# Patient Record
Sex: Female | Born: 2000 | Race: Black or African American | Hispanic: No | Marital: Single | Smoking: Never smoker
Health system: Southern US, Community
[De-identification: ages and names within clinical notes are randomized; demographics above are authoritative.]

## PROBLEM LIST (undated history)

## (undated) DIAGNOSIS — E282 Polycystic ovarian syndrome: Secondary | ICD-10-CM

## (undated) DIAGNOSIS — M549 Dorsalgia, unspecified: Secondary | ICD-10-CM

## (undated) DIAGNOSIS — J45909 Unspecified asthma, uncomplicated: Secondary | ICD-10-CM

## (undated) DIAGNOSIS — M2550 Pain in unspecified joint: Secondary | ICD-10-CM

## (undated) DIAGNOSIS — F909 Attention-deficit hyperactivity disorder, unspecified type: Secondary | ICD-10-CM

## (undated) DIAGNOSIS — G932 Benign intracranial hypertension: Secondary | ICD-10-CM

## (undated) HISTORY — DX: Pain in unspecified joint: M25.50

## (undated) HISTORY — DX: Attention-deficit hyperactivity disorder, unspecified type: F90.9

## (undated) HISTORY — DX: Benign intracranial hypertension: G93.2

## (undated) HISTORY — DX: Dorsalgia, unspecified: M54.9

## (undated) HISTORY — PX: NO PAST SURGERIES: SHX2092

---
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
## 2018-01-23 DIAGNOSIS — Z23 Encounter for immunization: Secondary | ICD-10-CM | POA: Diagnosis not present

## 2018-01-23 DIAGNOSIS — Z6841 Body Mass Index (BMI) 40.0 and over, adult: Secondary | ICD-10-CM | POA: Diagnosis not present

## 2018-01-23 DIAGNOSIS — Z131 Encounter for screening for diabetes mellitus: Secondary | ICD-10-CM | POA: Diagnosis not present

## 2018-01-23 DIAGNOSIS — Z7189 Other specified counseling: Secondary | ICD-10-CM | POA: Diagnosis not present

## 2018-03-26 DIAGNOSIS — N911 Secondary amenorrhea: Secondary | ICD-10-CM | POA: Diagnosis not present

## 2018-03-26 DIAGNOSIS — E782 Mixed hyperlipidemia: Secondary | ICD-10-CM | POA: Diagnosis not present

## 2018-03-26 DIAGNOSIS — E559 Vitamin D deficiency, unspecified: Secondary | ICD-10-CM | POA: Diagnosis not present

## 2018-03-26 DIAGNOSIS — D508 Other iron deficiency anemias: Secondary | ICD-10-CM | POA: Diagnosis not present

## 2018-04-03 DIAGNOSIS — N911 Secondary amenorrhea: Secondary | ICD-10-CM | POA: Diagnosis not present

## 2018-04-03 DIAGNOSIS — Z309 Encounter for contraceptive management, unspecified: Secondary | ICD-10-CM | POA: Diagnosis not present

## 2018-04-03 DIAGNOSIS — E6609 Other obesity due to excess calories: Secondary | ICD-10-CM | POA: Diagnosis not present

## 2018-04-03 DIAGNOSIS — Z6841 Body Mass Index (BMI) 40.0 and over, adult: Secondary | ICD-10-CM | POA: Diagnosis not present

## 2018-08-24 DIAGNOSIS — Z20828 Contact with and (suspected) exposure to other viral communicable diseases: Secondary | ICD-10-CM | POA: Diagnosis not present

## 2018-09-09 DIAGNOSIS — Z20828 Contact with and (suspected) exposure to other viral communicable diseases: Secondary | ICD-10-CM | POA: Diagnosis not present

## 2018-11-13 DIAGNOSIS — Z20828 Contact with and (suspected) exposure to other viral communicable diseases: Secondary | ICD-10-CM | POA: Diagnosis not present

## 2019-01-27 DIAGNOSIS — Z7189 Other specified counseling: Secondary | ICD-10-CM | POA: Diagnosis not present

## 2019-01-27 DIAGNOSIS — Z03818 Encounter for observation for suspected exposure to other biological agents ruled out: Secondary | ICD-10-CM | POA: Diagnosis not present

## 2019-06-09 DIAGNOSIS — Z6841 Body Mass Index (BMI) 40.0 and over, adult: Secondary | ICD-10-CM | POA: Diagnosis not present

## 2019-06-09 DIAGNOSIS — L678 Other hair color and hair shaft abnormalities: Secondary | ICD-10-CM | POA: Diagnosis not present

## 2019-06-09 DIAGNOSIS — Z113 Encounter for screening for infections with a predominantly sexual mode of transmission: Secondary | ICD-10-CM | POA: Diagnosis not present

## 2019-06-09 DIAGNOSIS — N920 Excessive and frequent menstruation with regular cycle: Secondary | ICD-10-CM | POA: Diagnosis not present

## 2019-06-09 DIAGNOSIS — N910 Primary amenorrhea: Secondary | ICD-10-CM | POA: Diagnosis not present

## 2019-07-01 ENCOUNTER — Other Ambulatory Visit: Payer: Self-pay | Admitting: Nurse Practitioner

## 2019-07-01 DIAGNOSIS — N920 Excessive and frequent menstruation with regular cycle: Secondary | ICD-10-CM | POA: Diagnosis not present

## 2019-07-01 DIAGNOSIS — E282 Polycystic ovarian syndrome: Secondary | ICD-10-CM | POA: Diagnosis not present

## 2019-07-03 ENCOUNTER — Ambulatory Visit
Admission: RE | Admit: 2019-07-03 | Discharge: 2019-07-03 | Disposition: A | Discharge status: Home / Self Care | Payer: BC Managed Care – PPO | Source: Ambulatory Visit | Attending: Nurse Practitioner | Admitting: Nurse Practitioner

## 2019-07-03 DIAGNOSIS — N920 Excessive and frequent menstruation with regular cycle: Secondary | ICD-10-CM

## 2020-01-06 ENCOUNTER — Other Ambulatory Visit: Payer: Self-pay

## 2020-01-06 ENCOUNTER — Ambulatory Visit
Admission: EM | Admit: 2020-01-06 | Discharge: 2020-01-06 | Disposition: A | Discharge status: Home / Self Care | Payer: BC Managed Care – PPO | Attending: Physician Assistant | Admitting: Physician Assistant

## 2020-01-06 DIAGNOSIS — R519 Headache, unspecified: Secondary | ICD-10-CM

## 2020-01-06 MED ORDER — KETOROLAC TROMETHAMINE 15 MG/ML IJ SOLN
15.0000 mg | Freq: Once | INTRAMUSCULAR | Status: AC
  Administered 2020-01-06: 15 mg via INTRAMUSCULAR

## 2020-01-06 MED ORDER — TIZANIDINE HCL 2 MG PO TABS: 2.0000 mg | ORAL_TABLET | Freq: Three times a day (TID) | ORAL | 0 refills | Status: DC | PRN

## 2020-01-06 MED ORDER — DEXAMETHASONE SODIUM PHOSPHATE 10 MG/ML IJ SOLN
10.0000 mg | Freq: Once | INTRAMUSCULAR | Status: AC
  Administered 2020-01-06: 10 mg via INTRAMUSCULAR

## 2020-01-06 MED ORDER — METOCLOPRAMIDE HCL 5 MG/ML IJ SOLN
5.0000 mg | Freq: Once | INTRAMUSCULAR | Status: AC
  Administered 2020-01-06: 5 mg via INTRAMUSCULAR

## 2020-01-06 MED ORDER — DICLOFENAC SODIUM 1 % EX GEL: 2.0000 g | Freq: Four times a day (QID) | CUTANEOUS | 0 refills | Status: DC

## 2020-01-06 NOTE — Discharge Instructions (Signed)
Toradol, Reglan, Decadron injection in office today.  As discussed, wonders for headaches as possible neck pain (tension headache). Tizanidine as needed, this can make you drowsy, so do not take if you are going to drive, operate heavy machinery, or make important decisions. Follow up with PCP/neurologist if symptoms not improving.  If having vision changes, passing out, confusion, one-sided weakness, confusion was present for further evaluation.

## 2020-01-06 NOTE — ED Triage Notes (Signed)
Pt states she has had a recurring headache for 2 weeks and treats it with ibuprofen and it returns shortly. Pt states she cannot eat or sleep due to the headaches causing nausea. Pt is aox4 and ambulatory.

## 2020-01-06 NOTE — ED Provider Notes (Signed)
[facility]    CSN: [id number] Arrival date & time: 01/06/20  7062      History   Chief Complaint Chief Complaint  Patient presents with  . Headache    x 2 weeks    HPI Stephanie Cohen is a 19 y.o. female.   19 year old female comes in for 2 week history of recurring headaches. States headache is from the neck up to the head, can be pounding in nature when severe. Nausea without vomiting. Photophobic, phonophobic at times. Denies head injury, LOC. Denies URI symptoms, fever. Had episodes of feeling weak.      History reviewed. No pertinent past medical history.  There are no problems to display for this patient.   History reviewed. No pertinent surgical history.  OB History   No obstetric history on file.      Home Medications    Prior to Admission medications   Medication Sig Start Date End Date Taking? Authorizing Provider  diclofenac Sodium (VOLTAREN) 1 % GEL Apply 2 g topically 4 (four) times daily. 01/06/20   Cathie Hoops, [place]ager [place] V, PA-C  tiZANidine (ZANAFLEX) 2 MG tablet Take 1 tablet (2 mg total) by mouth every 8 (eight) hours as needed for muscle spasms. 01/06/20   Belinda Fisher, PA-C    Family History History reviewed. No pertinent family history.  Social History Social History   Tobacco Use  . Smoking status: Never Smoker  . Smokeless tobacco: Never Used  Vaping Use  . Vaping Use: Never used  Substance Use Topics  . Alcohol use: Not Currently  . Drug use: Never     Allergies   Patient has no known allergies.   Review of Systems Review of Systems  Reason unable to perform ROS: See HPI as above.     Physical Exam Triage Vital Signs ED Triage Vitals  Enc Vitals Group     BP 01/06/20 1036 115/76     Pulse Rate 01/06/20 1036 88     Resp 01/06/20 1036 18     Temp 01/06/20 1036 98.4 F (36.9 C)     Temp Source 01/06/20 1036 Oral     SpO2 01/06/20 1036 98 %     Weight --      Height --      Head Circumference --      Peak Flow  --      Pain Score 01/06/20 1038 6     Pain Loc --      Pain Edu? --      Excl. in GC? --    No data found.  Updated Vital Signs BP 115/76 (BP Location: Right Arm)   Pulse 88   Temp 98.4 F (36.9 C) (Oral)   Resp 18   SpO2 98%   Physical Exam Constitutional:      General: She is not in acute distress.    Appearance: Normal appearance. She is well-developed. She is not toxic-appearing or diaphoretic.  HENT:     Head: Normocephalic and atraumatic.  Eyes:     Extraocular Movements: Extraocular movements intact.     Conjunctiva/sclera: Conjunctivae normal.     Pupils: Pupils are equal, round, and reactive to light.  Neck:     Comments: Tenderness to palpation of bilateral neck.  Pulmonary:     Effort: Pulmonary effort is normal. No respiratory distress.  Musculoskeletal:     Cervical back: Normal range of motion and neck supple.     Comments: Full ROM of  BUE. Strength/sensation intact.   Skin:    General: Skin is warm and dry.  Neurological:     Mental Status: She is alert and oriented to person, place, and time.     Comments: Cranial nerves II-XII grossly intact. Strength 5/5 bilaterally for upper and lower extremity. Sensation intact. Normal coordination. Negative pronator drift, romberg. Gait intact. Able to ambulate on own without difficulty.       UC Treatments / Results  Labs (all labs ordered are listed, but only abnormal results are displayed) Labs Reviewed - No data to display  EKG   Radiology No results found.  Procedures Procedures (including critical care time)  Medications Ordered in UC Medications  metoCLOPramide (REGLAN) injection 5 mg (has no administration in time range)  dexamethasone (DECADRON) injection 10 mg (has no administration in time range)  ketorolac (TORADOL) 15 MG/ML injection 15 mg (has no administration in time range)    Initial Impression / Assessment and Plan / UC Course  I have reviewed the triage vital signs and the  nursing notes.  Pertinent labs & imaging results that were available during my care of the patient were reviewed by me and considered in my medical decision making (see chart for details).    Neurology exam grossly intact without focal deficits.  Patient alert and oriented without slurring of words.  Toradol, Reglan, Decadron injection in office today. ?  Tension headache due to neck pain.  Will add on tizanidine for symptomatic management.  Return precautions given.  Final Clinical Impressions(s) / UC Diagnoses   Final diagnoses:  Acute intractable headache, unspecified headache type   ED Prescriptions    Medication Sig Dispense Auth. Provider   tiZANidine (ZANAFLEX) 2 MG tablet Take 1 tablet (2 mg total) by mouth every 8 (eight) hours as needed for muscle spasms. 15 tablet [name] V, PA-C   diclofenac Sodium (VOLTAREN) 1 % GEL Apply 2 g topically 4 (four) times daily. 50 g Belinda Fisher, PA-C     PDMP not reviewed this encounter.   Belinda Fisher, PA-C 01/06/20 1111

## 2020-01-07 DIAGNOSIS — Z131 Encounter for screening for diabetes mellitus: Secondary | ICD-10-CM | POA: Diagnosis not present

## 2020-01-07 DIAGNOSIS — Z23 Encounter for immunization: Secondary | ICD-10-CM | POA: Diagnosis not present

## 2020-01-07 DIAGNOSIS — G43809 Other migraine, not intractable, without status migrainosus: Secondary | ICD-10-CM | POA: Diagnosis not present

## 2020-01-07 DIAGNOSIS — Z1152 Encounter for screening for COVID-19: Secondary | ICD-10-CM | POA: Diagnosis not present

## 2020-01-29 DIAGNOSIS — G43809 Other migraine, not intractable, without status migrainosus: Secondary | ICD-10-CM | POA: Diagnosis not present

## 2020-12-05 ENCOUNTER — Other Ambulatory Visit: Payer: Self-pay

## 2020-12-05 ENCOUNTER — Ambulatory Visit (HOSPITAL_COMMUNITY)
Admission: EM | Admit: 2020-12-05 | Discharge: 2020-12-05 | Disposition: A | Discharge status: Home / Self Care | Payer: No Payment, Other | Attending: Registered Nurse | Admitting: Registered Nurse

## 2020-12-05 ENCOUNTER — Encounter (HOSPITAL_COMMUNITY): Payer: Self-pay | Admitting: Registered Nurse

## 2020-12-05 DIAGNOSIS — F909 Attention-deficit hyperactivity disorder, unspecified type: Secondary | ICD-10-CM | POA: Insufficient documentation

## 2020-12-05 DIAGNOSIS — Z8659 Personal history of other mental and behavioral disorders: Secondary | ICD-10-CM

## 2020-12-05 DIAGNOSIS — Z79899 Other long term (current) drug therapy: Secondary | ICD-10-CM

## 2020-12-05 NOTE — ED Provider Notes (Signed)
Behavioral Health Urgent Care Medical Screening Exam  Patient Name: Stephanie Cohen MRN: [id number] Date of Evaluation: 12/05/20 Chief Complaint:   Diagnosis:  Final diagnoses:  History of ADHD  Referred for medication therapy management    History of Present illness: Stephanie Cohen is a 20 y.o. female patient presented to [facility] as a walk in seeking treatment for ADHD  Stephanie Cohen, 64 y.o., female patient seen face to face by this provider, consulted with Dr. Earlene Plater; and chart reviewed on 12/05/20.  On evaluation Stephanie Cohen reports she has a history of ADHD and has been treated in the past.  States she is back in school and needs to establish outpatient services with provider for the treatment of ADHD medication management.  Patient states she was receiving services at [facility] but wasn't given any official documentation of diagnosis of ADHD, even though she was offered medication wanted to have the official documentation and testing for ADHD before starting any medication management.  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  Patient denies suicidal/self-harm/homicidal ideation, psychosis, and paranoia.    During evaluation Stephanie Cohen is sitting up right in chair in no acute distress.  She is alert/oriented x 4; calm/cooperative; and mood congruent with affect.  She is speaking in a clear tone at moderate volume, and normal pace; with good eye contact.  Her thought process is coherent and relevant; There is no indication that she is currently responding to internal/external stimuli or experiencing delusional thought content; and she has denied suicidal/self-harm/homicidal ideation, psychosis, and paranoia.   Patient has remained calm throughout assessment and has answered questions appropriately.  Unable to get an appointment  for medication management intake at [facility] recommended that she come during walk in hours.  Resources given.    At this time Stephanie Cohen is educated and verbalizes understanding of mental health resources and other crisis services in the community. She is instructed to call 911 and present to the nearest emergency room should she experience any suicidal/homicidal ideation, auditory/visual/hallucinations, or detrimental worsening of her mental health condition.  She was also given a list of resources that offer outpatient psychiatric services and informed that she could come to [facility] walk in hours for medication management intake.    Psychiatric Specialty Exam  Presentation  General Appearance:Appropriate for Environment; Casual  Eye Contact:Good  Speech:Clear and Coherent; Normal Rate  Speech Volume:Normal  Handedness:Right   Mood and Affect  Mood:Euthymic  Affect:Appropriate; Congruent   Thought Process  Thought Processes:Coherent; Goal Directed  Descriptions of Associations:Intact  Orientation:Full (Time, Place and Person)  Thought Content:WDL; Logical    Hallucinations:None  Ideas of Reference:None  Suicidal Thoughts:No  Homicidal Thoughts:No   Sensorium  Memory:Immediate Good; Recent Good; Remote Good  Judgment:Intact  Insight:Present   Executive Functions  Concentration:Good  Attention Span:Good  Recall:Good  Fund of Knowledge:Good  Language:Good   Psychomotor Activity  Psychomotor Activity:Normal   Assets  Assets:Communication Skills; Desire for Improvement; Housing; Resilience; Social Support; Transportation   Sleep  Sleep:Good  Number of hours:  No data recorded  Nutritional Assessment (For OBS and FBC admissions only) Has the patient had a weight loss or gain of 10 pounds or more in the last 3 months?: No Has the patient had a decrease in food intake/or appetite?: No Does the patient have  dental problems?: No Does the  patient have eating habits or behaviors that may be indicators of an eating disorder including binging or inducing vomiting?: No Has the patient recently lost weight without trying?: 0 Has the patient been eating poorly because of a decreased appetite?: 0 Malnutrition Screening Tool Score: 0    Physical Exam: Physical Exam Vitals and nursing note reviewed. Exam conducted with a chaperone present.  Constitutional:      General: She is not in acute distress.    Appearance: Normal appearance. She is not ill-appearing.  Cardiovascular:     Rate and Rhythm: Normal rate.  Pulmonary:     Effort: Pulmonary effort is normal.  Musculoskeletal:        General: Normal range of motion.     Cervical back: Normal range of motion.  Skin:    General: Skin is warm and dry.  Neurological:     Mental Status: She is alert and oriented to person, place, and time.  Psychiatric:        Attention and Perception: Attention and perception normal. She does not perceive auditory or visual hallucinations.        Mood and Affect: Mood and affect normal.        Speech: Speech normal.        Behavior: Behavior normal. Behavior is cooperative.        Thought Content: Thought content normal. Thought content is not paranoid or delusional. Thought content does not include homicidal or suicidal ideation.        Cognition and Memory: Cognition and memory normal.        Judgment: Judgment normal.   Review of Systems  Constitutional: Negative.   HENT: Negative.    Eyes: Negative.   Respiratory: Negative.    Cardiovascular: Negative.   Gastrointestinal: Negative.   Genitourinary: Negative.   Musculoskeletal: Negative.   Skin: Negative.   Neurological: Negative.   Endo/Heme/Allergies: Negative.   Psychiatric/Behavioral:  Negative for depression (Denies), hallucinations (Denies), substance abuse (Denies) and suicidal ideas (Denies). The patient is not nervous/anxious (Denies) and does not have insomnia.         Patient reporting history of ADHD and treated in the past.  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 rate 18, SpO2 97 %. There is no height or weight on file to calculate BMI.  Musculoskeletal: Strength & Muscle Tone: within normal limits Gait & Station: normal Patient leans: N/A   [facility]SE Discharge Disposition for Follow up and Recommendations: Based on my evaluation the patient does not appear to have an emergency medical condition and can be discharged with resources and follow up care in outpatient services for Medication Management Referral to [facility] for medication management    Follow-up Information     [facility].   Specialty: Urgent Care Why: New Therapy walkin:  Monday-Wednesday from 7:30am-12:30pm.  New Medication management walkin Monday-Friday from 7:30 am to 11:00am.  Patient will be taken in the order that they come.  You may not be seen on the same day as walkin. first come first serve Contact information: [address] [place] [address] [phone]                 Assunta Found, NP 12/05/2020, 4:05 PM

## 2020-12-05 NOTE — Progress Notes (Signed)
   12/05/20 1616  [facility] Triage Screening (Walk-ins at [facility] only)  How Did You Hear About [place]? Self  What Is the Reason for Your Visit/Call Today? Patient presents requesting assessment.  She states she has been diagnosed by [facility] with ADHD, however there was no formal diagnosis or documentation.  She is requesting a diagnostic evaluation and medication if recommended.  Patient denies SI, HI and AVH.  She denies SA hx.  Patient is being referred to [facility] for further evaluation.  How Long Has This Been Causing You Problems? > than 6 months  Have You Recently Had Any Thoughts About Hurting Yourself? No  Are You Planning to Commit Suicide/Harm Yourself At This time? No  Have you Recently Had Thoughts About Hurting Someone Karolee Ohs? No  Are You Planning To Harm Someone At This Time? No  Are you currently experiencing any auditory, visual or other hallucinations? No  Have You Used Any Alcohol or Drugs in the Past 24 Hours? No  Do you have any current medical co-morbidities that require immediate attention? No  Clinician description of patient physical appearance/behavior: Patient is calm, cooperative and pleasant.  What Do You Feel Would Help You the Most Today? Medication(s);Treatment for Depression or other mood problem  If access to [facility] was not available, would you have sought care in the Emergency Department? No  Determination of Need Routine (7 days)  Options For Referral Outpatient Therapy;Medication Management

## 2021-08-04 ENCOUNTER — Ambulatory Visit
Admission: EM | Admit: 2021-08-04 | Discharge: 2021-08-04 | Disposition: A | Discharge status: Home / Self Care | Payer: 59

## 2021-08-04 DIAGNOSIS — H53122 Transient visual loss, left eye: Secondary | ICD-10-CM | POA: Diagnosis not present

## 2021-08-04 HISTORY — DX: Polycystic ovarian syndrome: E28.2

## 2021-08-04 HISTORY — DX: Unspecified asthma, uncomplicated: J45.909

## 2021-08-04 NOTE — ED Provider Notes (Signed)
[facility]    CSN: [id number] Arrival date & time: 08/04/21  1928      History   Chief Complaint Chief Complaint  Patient presents with   left eye vision loss    HPI Stephanie Cohen is a 21 y.o. female.   Patient presents due to concerns of eye redness and vision loss of left eye.  Patient reports that she was seen by telehealth and prescribed Polytrim antibiotic eyedrops for suspected bacterial conjunctivitis of both eyes.  She had eye redness, itchiness, discomfort of bilateral eyes with watery drainage over the past few days.  Symptoms have improved with antibiotic drops.  She is concerned today due to an episode that lasted a few seconds of complete vision loss in the left eye.  This has now resolved.  Denies any associated headache, dizziness, nausea, vomiting.  Denies any recent head injuries.  Patient does wear contacts and wears glasses.  Patient states that she has not worn her contacts since symptoms started.    Past Medical History:  Diagnosis Date   Asthma    PCOS (polycystic ovarian syndrome)     Patient Active Problem List   Diagnosis Date Noted   History of ADHD 12/05/2020   Referred for medication therapy management 12/05/2020    History reviewed. No pertinent surgical history.  OB History   No obstetric history on file.      Home Medications    Prior to Admission medications   Medication Sig Start Date End Date Taking? Authorizing Provider  diclofenac Sodium (VOLTAREN) 1 % GEL Apply 2 g topically 4 (four) times daily. 01/06/20   Cathie Hoops, Amy V, PA-C  tiZANidine (ZANAFLEX) 2 MG tablet Take 1 tablet (2 mg total) by mouth every 8 (eight) hours as needed for muscle spasms. 01/06/20   Belinda Fisher, PA-C    Family History History reviewed. No pertinent family history.  Social History Social History   Tobacco Use   Smoking status: Never   Smokeless tobacco: Never  Vaping Use   Vaping Use: Never used  Substance Use Topics   Alcohol  use: Not Currently   Drug use: Never     Allergies   Patient has no known allergies.   Review of Systems Review of Systems Per HPI  Physical Exam Triage Vital Signs ED Triage Vitals [08/04/21 1940]  Enc Vitals Group     BP 105/68     Pulse Rate 90     Resp 18     Temp 98.1 F (36.7 C)     Temp Source Oral     SpO2 96 %     Weight      Height      Head Circumference      Peak Flow      Pain Score 0     Pain Loc      Pain Edu?      Excl. in GC?    No data found.  Updated Vital Signs BP 105/68 (BP Location: Left Arm)   Pulse 90   Temp 98.1 F (36.7 C) (Oral)   Resp 18   SpO2 96%   Visual Acuity Right Eye Distance: 20/20 Left Eye Distance: 20/30 Bilateral Distance: 20/20  Right Eye Near:   Left Eye Near:    Bilateral Near:     Physical Exam Constitutional:      General: She is not in acute distress.    Appearance: Normal appearance. She is not toxic-appearing or diaphoretic.  HENT:     Head: Normocephalic and atraumatic.  Eyes:     General: Lids are normal. Lids are everted, no foreign bodies appreciated. Vision grossly intact. Gaze aligned appropriately.     Extraocular Movements: Extraocular movements intact.     Conjunctiva/sclera: Conjunctivae normal.     Pupils: Pupils are equal, round, and reactive to light.  Cardiovascular:     Rate and Rhythm: Normal rate and regular rhythm.     Pulses: Normal pulses.     Heart sounds: Normal heart sounds.  Pulmonary:     Effort: Pulmonary effort is normal.     Breath sounds: Normal breath sounds.  Neurological:     General: No focal deficit present.     Mental Status: She is alert and oriented to person, place, and time. Mental status is at baseline.     Cranial Nerves: Cranial nerves 2-12 are intact.     Sensory: Sensation is intact.     Motor: Motor function is intact.     Coordination: Coordination is intact.     Gait: Gait is intact.  Psychiatric:        Mood and Affect: Mood normal.         Behavior: Behavior normal.        Thought Content: Thought content normal.        Judgment: Judgment normal.     UC Treatments / Results  Labs (all labs ordered are listed, but only abnormal results are displayed) Labs Reviewed - No data to display  EKG   Radiology No results found.  Procedures Procedures (including critical care time)  Medications Ordered in UC Medications - No data to display  Initial Impression / Assessment and Plan / UC Course  I have reviewed the triage vital signs and the nursing notes.  Pertinent labs & imaging results that were available during my care of the patient were reviewed by me and considered in my medical decision making (see chart for details).     There were no obvious abnormalities noted on eye exam.  Low concern for conjunctivitis on exam.  Dr. Dione Booze with on-call ophthalmology was called due to concern for vision loss.  He did not think that emergent evaluation with ophthalmology was necessary given that vision loss had resolved.  He recommended stroke work-up if necessary.  Therefore, patient was advised to go to the ER for further evaluation and management to rule out neurological complications.  Patient declined going to the hospital.  Risks associated with not going to the hospital were discussed with patient.  Patient voiced understanding.  Patient wished to simply follow-up with ophthalmology in the next few days. Provided patient with contact info.  Patient was also given strict return and ER precautions.  Visual acuity was normal on discharge and exam.  Patient verbalized understanding and was agreeable with plan. Final Clinical Impressions(s) / UC Diagnoses   Final diagnoses:  Episode of visual loss of left eye     Discharge Instructions      Please go to the ER if symptoms of headache, blurred vision, nausea, vomiting, dizziness occur.  Follow-up with provided contact information for ophthalmology if symptoms just simply  persist.    ED Prescriptions   None    PDMP not reviewed this encounter.   Gustavus Bryant, [place] 08/04/21 2003

## 2021-08-04 NOTE — Discharge Instructions (Signed)
Please go to the ER if symptoms of headache, blurred vision, nausea, vomiting, dizziness occur.  Follow-up with provided contact information for ophthalmology if symptoms just simply persist.

## 2021-08-04 NOTE — ED Triage Notes (Signed)
Pt c/o conjunctivitis seen by telehealth and given abx gtts. States today her left eye was more edematous and red. States in waiting area her left eye "suddenly went black" but didn't last for long.   Denies headache, nausea

## 2021-08-08 ENCOUNTER — Encounter (HOSPITAL_COMMUNITY): Payer: Self-pay | Admitting: Internal Medicine

## 2021-08-08 ENCOUNTER — Observation Stay (HOSPITAL_COMMUNITY): Payer: 59

## 2021-08-08 ENCOUNTER — Other Ambulatory Visit: Payer: Self-pay

## 2021-08-08 ENCOUNTER — Emergency Department (HOSPITAL_COMMUNITY): Payer: 59

## 2021-08-08 ENCOUNTER — Inpatient Hospital Stay (HOSPITAL_COMMUNITY)
Admission: EM | Admit: 2021-08-08 | Discharge: 2021-08-10 | DRG: 103 | Disposition: A | Discharge status: Home / Self Care | Payer: 59 | Source: Ambulatory Visit | Attending: Internal Medicine | Admitting: Internal Medicine

## 2021-08-08 DIAGNOSIS — J4520 Mild intermittent asthma, uncomplicated: Secondary | ICD-10-CM | POA: Diagnosis present

## 2021-08-08 DIAGNOSIS — G932 Benign intracranial hypertension: Secondary | ICD-10-CM | POA: Diagnosis not present

## 2021-08-08 DIAGNOSIS — Z6841 Body Mass Index (BMI) 40.0 and over, adult: Secondary | ICD-10-CM

## 2021-08-08 DIAGNOSIS — E662 Morbid (severe) obesity with alveolar hypoventilation: Secondary | ICD-10-CM | POA: Diagnosis not present

## 2021-08-08 DIAGNOSIS — H53123 Transient visual loss, bilateral: Secondary | ICD-10-CM | POA: Diagnosis not present

## 2021-08-08 DIAGNOSIS — H539 Unspecified visual disturbance: Secondary | ICD-10-CM | POA: Diagnosis not present

## 2021-08-08 DIAGNOSIS — D72829 Elevated white blood cell count, unspecified: Secondary | ICD-10-CM | POA: Diagnosis present

## 2021-08-08 DIAGNOSIS — E282 Polycystic ovarian syndrome: Secondary | ICD-10-CM | POA: Diagnosis present

## 2021-08-08 DIAGNOSIS — Z79899 Other long term (current) drug therapy: Secondary | ICD-10-CM

## 2021-08-08 DIAGNOSIS — H509 Unspecified strabismus: Secondary | ICD-10-CM | POA: Diagnosis present

## 2021-08-08 LAB — CBC WITH DIFFERENTIAL/PLATELET
Abs Immature Granulocytes: 0.06 10*3/uL (ref 0.00–0.07)
Basophils Absolute: 0.1 10*3/uL (ref 0.0–0.1)
Basophils Relative: 1 %
Eosinophils Absolute: 0.3 10*3/uL (ref 0.0–0.5)
Eosinophils Relative: 3 %
HCT: 36.8 % (ref 36.0–46.0)
Hemoglobin: 12.1 g/dL (ref 12.0–15.0)
Immature Granulocytes: 1 %
Lymphocytes Relative: 33 %
Lymphs Abs: 4.2 10*3/uL — ABNORMAL HIGH (ref 0.7–4.0)
MCH: 20.3 pg — ABNORMAL LOW (ref 26.0–34.0)
MCHC: 32.9 g/dL (ref 30.0–36.0)
MCV: 61.8 fL — ABNORMAL LOW (ref 80.0–100.0)
Monocytes Absolute: 0.8 10*3/uL (ref 0.1–1.0)
Monocytes Relative: 6 %
Neutro Abs: 7.3 10*3/uL (ref 1.7–7.7)
Neutrophils Relative %: 56 %
Platelets: 366 10*3/uL (ref 150–400)
RBC: 5.95 MIL/uL — ABNORMAL HIGH (ref 3.87–5.11)
RDW: 17.1 % — ABNORMAL HIGH (ref 11.5–15.5)
WBC: 12.7 10*3/uL — ABNORMAL HIGH (ref 4.0–10.5)
nRBC: 0 % (ref 0.0–0.2)

## 2021-08-08 LAB — I-STAT BETA HCG BLOOD, ED (MC, WL, AP ONLY): I-stat hCG, quantitative: <5 m[IU]/mL (ref <5)

## 2021-08-08 LAB — COMPREHENSIVE METABOLIC PANEL
ALT: 19 U/L (ref 0–44)
AST: 29 U/L (ref 15–41)
Albumin: 3.9 g/dL (ref 3.5–5.0)
Alkaline Phosphatase: 56 U/L (ref 38–126)
Anion gap: 9 (ref 5–15)
BUN: 10 mg/dL (ref 6–20)
CO2: 23 mmol/L (ref 22–32)
Calcium: 9.2 mg/dL (ref 8.9–10.3)
Chloride: 104 mmol/L (ref 98–111)
Creatinine, Ser: 0.76 mg/dL (ref 0.44–1.00)
GFR, Estimated: >60 mL/min (ref >60)
Glucose, Bld: 116 mg/dL — ABNORMAL HIGH (ref 70–99)
Potassium: 4.1 mmol/L (ref 3.5–5.1)
Sodium: 136 mmol/L (ref 135–145)
Total Bilirubin: 0.2 mg/dL — ABNORMAL LOW (ref 0.3–1.2)
Total Protein: 7.3 g/dL (ref 6.5–8.1)

## 2021-08-08 LAB — URINALYSIS, ROUTINE W REFLEX MICROSCOPIC
Bilirubin Urine: NEGATIVE
Glucose, UA: NEGATIVE mg/dL
Hgb urine dipstick: NEGATIVE
Ketones, ur: NEGATIVE mg/dL
Leukocytes,Ua: NEGATIVE
Nitrite: NEGATIVE
Protein, ur: NEGATIVE mg/dL
Specific Gravity, Urine: 1.028 (ref 1.005–1.030)
pH: 5 (ref 5.0–8.0)

## 2021-08-08 MED ORDER — GADOBUTROL 1 MMOL/ML IV SOLN
10.0000 mL | Freq: Once | INTRAVENOUS | Status: AC | PRN
  Administered 2021-08-08: 10 mL via INTRAVENOUS

## 2021-08-08 MED ORDER — MELATONIN 5 MG PO TABS: 10.0000 mg | ORAL_TABLET | Freq: Every evening | ORAL | Status: DC | PRN

## 2021-08-08 MED ORDER — ACETAMINOPHEN 325 MG PO TABS
650.0000 mg | ORAL_TABLET | Freq: Four times a day (QID) | ORAL | Status: DC | PRN
  Administered 2021-08-09 – 2021-08-10 (×3): 650 mg via ORAL
  Filled 2021-08-08 (×3): qty 2

## 2021-08-08 MED ORDER — ACETAMINOPHEN 650 MG RE SUPP: 650.0000 mg | Freq: Four times a day (QID) | RECTAL | Status: DC | PRN

## 2021-08-08 MED ORDER — POLYETHYLENE GLYCOL 3350 17 G PO PACK: 17.0000 g | PACK | Freq: Every day | ORAL | Status: DC | PRN

## 2021-08-08 MED ORDER — ALBUTEROL SULFATE (2.5 MG/3ML) 0.083% IN NEBU: 2.5000 mg | INHALATION_SOLUTION | RESPIRATORY_TRACT | Status: DC | PRN

## 2021-08-08 MED ORDER — ONDANSETRON HCL 4 MG/2ML IJ SOLN: 4.0000 mg | Freq: Four times a day (QID) | INTRAMUSCULAR | Status: DC | PRN

## 2021-08-08 MED ORDER — ONDANSETRON HCL 4 MG PO TABS: 4.0000 mg | ORAL_TABLET | Freq: Four times a day (QID) | ORAL | Status: DC | PRN

## 2021-08-08 MED ORDER — OXYCODONE-ACETAMINOPHEN 5-325 MG PO TABS: 1.0000 | ORAL_TABLET | Freq: Four times a day (QID) | ORAL | Status: DC | PRN

## 2021-08-08 NOTE — ED Notes (Signed)
Pt transported to MRI 

## 2021-08-08 NOTE — Assessment & Plan Note (Signed)
   Notable leukocytosis on arrival  No evidence of concurrent fever  No focal evidence of infection on exam  Obtaining urinalysis and chest x-ray

## 2021-08-08 NOTE — Assessment & Plan Note (Addendum)
   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 Theda Sers with neurology who advises fluoroscopic lumbar puncture with initiation of Diamox if opening pressures are indeed elevated  Due to patient's body habitus EDP has made arrangements with radiology for radiology to proceed with lumbar puncture  We will follow-up on results initiate Diamox if indicated  As needed analgesics and antiemetics in the meantime

## 2021-08-08 NOTE — Assessment & Plan Note (Signed)
?   No evidence of acute asthma exacerbation ?? As needed bronchodilator therapy for shortness of breath and wheezing. ? ?

## 2021-08-08 NOTE — ED Provider Notes (Signed)
  Physical Exam  BP 110/60 (BP Location: Left Arm)   Pulse 78   Temp 98.2 F (36.8 C) (Oral)   Resp 17   Ht 5\' 4"  (1.626 m)   Wt (!) 138.8 kg   LMP  (LMP Unknown)   SpO2 100%   BMI 52.52 kg/m   Physical Exam Vitals and nursing note reviewed.  Constitutional:      General: She is not in acute distress.    Appearance: She is well-developed.  HENT:     Head: Normocephalic and atraumatic.  Eyes:     Conjunctiva/sclera: Conjunctivae normal.  Cardiovascular:     Rate and Rhythm: Normal rate and regular rhythm.     Heart sounds: No murmur heard. Pulmonary:     Effort: Pulmonary effort is normal. No respiratory distress.     Breath sounds: Normal breath sounds.  Abdominal:     Palpations: Abdomen is soft.     Tenderness: There is no abdominal tenderness.  Musculoskeletal:        General: No swelling.     Cervical back: Neck supple.  Skin:    General: Skin is warm and dry.     Capillary Refill: Capillary refill takes less than 2 seconds.  Neurological:     Mental Status: She is alert.  Psychiatric:        Mood and Affect: Mood normal.    Procedures  Procedures  ED Course / MDM   Clinical Course as of 08/08/21 1918  Tue Aug 08, 2021  1505 CBC with Differential(!) White blood cell count elevated [JK]  1531 Comprehensive metabolic panel(!) Normal [JK]  1531 I-Stat Beta hCG blood, ED ([facility], [facility], [facility] only) Pregnancy negative [JK]  1614 IIH, pending neuro recs [MK]    Clinical Course User Index [JK] Aug 10, 2021, MD [MK] Dlisa Barnwell, MD   Medical Decision Making Amount and/or Complexity of Data Reviewed Labs:  Decision-making details documented in ED Course. Radiology: ordered.  Risk Prescription drug management.   Patient received in handoff.  Concern for pseudotumor and transferred from outside hospital.  Pending neuro evaluation and MRIs.  Neuro recommending MRI and likely LP.  MRI with signs and symptoms concerning for papilledema but no evidence of MS.   Given patient's elevated BMI, I spoke with diagnostic radiology who states that they will pursue fluoroscopy guided LP tomorrow morning.  Patient then admitted to medicine.       Linwood Dibbles, MD 08/08/21 1919

## 2021-08-08 NOTE — ED Triage Notes (Signed)
Pt. Stated, I went to my eye Dr. This morning because Ive lost my vision about 3-4 times for a couple of seconds. My eye Dr. Durene Cal me here for evaluation.

## 2021-08-08 NOTE — Consult Note (Addendum)
Neurology Consult H&P  Stephanie Cohen MR# [id number] 08/08/2021   CC: Headache and papilledema History is obtained from: Patient, mother, friend and chart.  HPI: Stephanie Cohen is a 21 y.o. female PMHx as reviewed below she states that she has had headaches on and off for quite some time ((fall 2021) however recently approximate a month ago she started developing a knocking sensation in the back of her head which was somewhat uncomfortable however nonradiating no associated nausea or photophobia.  She states that the headache is essentially a pressure-like feeling which she feels more intensely on the left than the right this is associated with transient loss of vision less than 15 seconds which only occurs when she bends over and stands up quickly.  She was seen by ophthalmology and noted to have bilateral papilledema and referred for  ROS: A complete ROS was performed and is negative except as noted in the HPI.   Past Medical History:  Diagnosis Date   Asthma    PCOS (polycystic ovarian syndrome)      No family history on file.  Social History:  reports that she has never smoked. She has never used smokeless tobacco. She reports that she does not currently use alcohol. She reports that she does not use drugs.   Prior to Admission medications   Medication Sig Start Date End Date Taking? Authorizing Provider  diclofenac Sodium (VOLTAREN) 1 % GEL Apply 2 g topically 4 (four) times daily. 01/06/20   Tasia Catchings, Amy V, PA-C  tiZANidine (ZANAFLEX) 2 MG tablet Take 1 tablet (2 mg total) by mouth every 8 (eight) hours as needed for muscle spasms. 01/06/20   Ok Edwards, PA-C    Exam: Current vital signs: BP 110/60 (BP Location: Left Arm)   Pulse 78   Temp 98.2 F (36.8 C) (Oral)   Resp 17   Ht 5\' 4"  (1.626 m)   Wt (!) 138.8 kg   LMP  (LMP Unknown)   SpO2 100%   BMI 52.52 kg/m   Physical Exam  Constitutional: Appears well-developed and well-nourished.  Psych: Affect  appropriate to situation Eyes: No scleral injection HENT: No OP obstruction. Head: Normocephalic.  Cardiovascular: Normal rate and regular rhythm.  Respiratory: Effort normal, symmetric excursions bilaterally, no audible wheezing. GI: Soft.  No distension. There is no tenderness.  Skin: WDI  Neuro: Mental Status: Patient is awake, alert, oriented to person, place, month, year, and situation. Patient is able to give a clear and coherent history. Speech  fluent, intact comprehension and repetition. No signs of aphasia or neglect. Visual Fields are full. Pupils are equal, round, and reactive to light. EOMI without ptosis or diplopia.  Funduscopic exam revealed bilateral papilledema. Facial sensation is symmetric to temperature Facial movement is symmetric.  Hearing is intact to voice. Uvula midline and palate elevates symmetrically. Shoulder shrug is symmetric. Tongue is midline without atrophy or fasciculations.  Tone is normal. Bulk is normal. 5/5 strength was present in all four extremities. Sensation is symmetric to light touch and temperature in the arms and legs. Deep Tendon Reflexes: 2+ and symmetric in the biceps and patellae. Toes are downgoing bilaterally. FNF and HKS are intact bilaterally. Gait -gait is normal We were able to recreate her visual loss which she was able to do by rubbing her eye for about 30 seconds which ultimately caused very brief approximately 15 seconds black and gray curtain which started from the top and migrated down and fully recovered.  I have reviewed  labs in epic and the pertinent results are: Serum glucose 116  I have reviewed the images obtained: MRI brain showedslight protrusion of the optic papilla bilaterally and mild prominence of the optic nerve sheaths.  Dedicated MRI orbits with contrast showed prominence of the optic nerve sheaths prominence of the optic papilla bilaterally as well as strabismus.  Assessment: Stephanie Cohen is  a 21 y.o. female PMHx as noted above with headache which began fall 2021.  Based on her symptoms of headache and funduscopic exam in conjunction with her body habitus she will likely has idiopathic intracranial hypertension.  We discussed primary measures which is losing 24% of her body weight and we concluded that this is the least invasive and more desirable way to go prior to being evaluated for shunt placement.  The patient arrived with her ophthalmologist notes which was thoroughly reviewed with.  Plan: She is scheduled for fluoroscopic LP based on the opening pressure we will consider starting Diamox 500 mg twice daily and if the opening pressure is high and the headache is persistent we may increase to 750 milligrams twice daily while we continue to monitor heart her serum potassium levels.  She is currently off the floor taken for LP and results should be back shortly.   Electronically signed by:  Lynnae Sandhoff, MD Page: [id number] 08/08/2021, 6:48 PM  If 7pm- 7am, please page neurology on call as listed in [place].

## 2021-08-08 NOTE — ED Provider Triage Note (Signed)
Emergency Medicine Provider Triage Evaluation Note  Stephanie Cohen , a 21 y.o. female  was evaluated in triage.  Patient sent by eye doctor with papilledema associated with increased intracranial pressure.  Concern for amaurosis fugax.  Paperwork reads as follows:  "Given that this seems to be vision threatening, I recommend evaluation/management through the emergency department.  She will need full IIH work-up including CT brain/orbits, LP with opening pressure and CSF analysis.  Also will need neurology consult.  She needs to be started on acetazolamide in the interim.  I am concerned that with TVO's OS, we are risking permanent vision loss without aggressive management"   Review of Systems  Positive: Intermittent vision loss and gradual worsening of vision bilaterally Negative: Pain  Physical Exam  BP 120/69 (BP Location: Left Arm)   Pulse 85   Temp 98.5 F (36.9 C) (Oral)   Resp 17   SpO2 99%  Gen:   Awake, no distress   Resp:  Normal effort  MSK:   Moves extremities without difficulty  Other:  EOMs intact, perrla  Medical Decision Making  Medically screening exam initiated at 11:19 AM.  Appropriate orders placed.  Stephanie Cohen was informed that the remainder of the evaluation will be completed by another provider, this initial triage assessment does not replace that evaluation, and the importance of remaining in the ED until their evaluation is complete.   Lab work and CTs ordered.  Any lumbar puncture will be at the discretion of ED staff and performed in the back.  Likely ultimately will need MRI but CTs ordered per ophthalmologist     Keeli Roberg A, PA-C 08/08/21 1130

## 2021-08-08 NOTE — H&P (Signed)
History and Physical    Patient: Stephanie Cohen MRN: [id number] DOA: 08/08/2021  Date of Service: the patient was seen and examined on 08/08/2021  Patient coming from: Ophthalmology office  Chief Complaint:  Chief Complaint  Patient presents with   Eye Problem    HPI:   21 year old female with past medical history of asthma, PCOS and obesity who presents to [facility] emergency department from her ophthalmologist office with complaints of visual changes and headaches.  Patient explains that for the past several months she has experienced intermittent headaches.  Headaches have varied in intensity and location.   Patient denied any associated photophobia nausea or vomiting with the symptoms.  Lately,  over the next several weeks patient has noticed that she has been developing waxing and waning loss of vision, usually in her left eye.  Episodes seem to occur in certain positions, particularly when the patient is squatting.  Episodes would typically last approximately 15 seconds at a time and spontaneously resolved.  Patient denies any associated eye pain.  Due to persisting symptoms patient was evaluated by a local ophthalmologist earlier in the day on 5/23 who identified that the patient was exhibiting evidence of papilledema and sent the patient to [facility] emergency department for urgent evaluation.  Upon evaluation in the emergency department MRI brain without contrast in addition to MRI of the orbits with and without contrast identified slight protrusion of the optic papilla bilaterally and mild prominence of the optic nerve sheaths that could potentially represent papilledema.  ER provider discussed case with Dr. Thomasena Edis with neurology felt that the patient may very well be suffering from idiopathic intracranial hypertension and advised obtaining a lumbar puncture.  Due to the patient's morbid obesity ER provider was able to speak to radiology and they agreed to  perform a lumbar puncture either this evening or early tomorrow morning.  Neurology has recommended prompt initiation of Diamox once these results are back.  In the meantime, the hospitalist group has been called to assess the patient for admission to the hospital.  Review of Systems: Review of Systems  Eyes:  Positive for blurred vision.  Neurological:  Positive for headaches.    Past Medical History:  Diagnosis Date   Asthma    PCOS (polycystic ovarian syndrome)     No past surgical history on file.  Social History:  reports that she has never smoked. She has never used smokeless tobacco. She reports that she does not currently use alcohol. She reports that she does not use drugs.  No Known Allergies  Family History  Problem Relation Age of Onset   Stroke Neg Hx     Prior to Admission medications   Medication Sig Start Date End Date Taking? Authorizing Provider  cetirizine (ZYRTEC) 10 MG tablet Take 10 mg by mouth 2 (two) times daily.   Yes [provider]  diclofenac Sodium (VOLTAREN) 1 % GEL Apply 2 g topically 4 (four) times daily. Patient not taking: Reported on 08/08/2021 01/06/20   Belinda Fisher, PA-C  tiZANidine (ZANAFLEX) 2 MG tablet Take 1 tablet (2 mg total) by mouth every 8 (eight) hours as needed for muscle spasms. Patient not taking: Reported on 08/08/2021 01/06/20   Belinda Fisher, PA-C    Physical Exam:  Vitals:   08/08/21 1827 08/08/21 2010 08/08/21 2022 08/08/21 2138  BP: 110/60 (!) 102/53 (!) 149/105 107/63  Pulse: 78 79 90 93  Resp: 17 16 18 16   Temp:  98.2  F (36.8 C) 98.2 F (36.8 C) 97.6 F (36.4 C)  TempSrc:  Oral Oral Oral  SpO2: 100% 99% 100% 100%  Weight:      Height:        Constitutional: Awake alert and oriented x3, no associated distress.   Skin: no rashes, no lesions, good skin turgor noted. Eyes: Pupils are equally reactive to light.  No evidence of scleral icterus or conjunctival pallor.  ENMT: Moist mucous membranes noted.   Posterior pharynx clear of any exudate or lesions.   Neck: normal, supple, no masses, no thyromegaly.  No evidence of jugular venous distension.   Respiratory: clear to auscultation bilaterally, no wheezing, no crackles. Normal respiratory effort. No accessory muscle use.  Cardiovascular: Regular rate and rhythm, no murmurs / rubs / gallops. No extremity edema. 2+ pedal pulses. No carotid bruits.  Chest:   Nontender without crepitus or deformity.   Back:   Nontender without crepitus or deformity. Abdomen: Abdomen is soft and nontender.  No evidence of intra-abdominal masses.  Positive bowel sounds noted in all quadrants.   Musculoskeletal: No joint deformity upper and lower extremities. Good ROM, no contractures. Normal muscle tone.  Neurologic: CN 2-12 grossly intact. Sensation intact.  Patient moving all 4 extremities spontaneously.  Patient is following all commands.  Patient is responsive to verbal stimuli.   Psychiatric: Patient exhibits normal mood with appropriate affect.  Patient seems to possess insight as to their current situation.    Data Reviewed:  I have personally reviewed and interpreted labs, imaging.  Significant findings are:  Chemistry revealing sodium of 136, potassium 4.1, glucose 116. CBC revealing white blood cell count of 12.7, hemoglobin 12.1, platelet count of 366. MRI of the orbits with and without contrast as well as MR brain without contrast revealing slight protrusion of the optic papilla bilaterally with mild prominence of the optic nerve sheaths which could potentially represent papilledema. Noncontrast CT head reveals no acute abnormality  EKG: Personally reviewed.  Rhythm is NSR with heart rate of 87.  No dynamic ST segment changes appreciated.    Assessment and Plan: * Idiopathic intracranial hypertension Patient experiencing waxing and waning headaches as well as visual loss involving over approximate the past month Papilledema identified by patient's  ophthalmologist earlier in the day MRI imaging consistent with papilledema Clinical concern for idiopathic intracranial hypertension Patient is already been evaluated by Dr. Thomasena Edisollins with neurology who advises fluoroscopic lumbar puncture with initiation of Diamox if opening pressures are indeed elevated Due to patient's body habitus EDP has made arrangements with radiology for radiology to proceed with lumbar puncture We will follow-up on results initiate Diamox if indicated As needed analgesics and antiemetics in the meantime   Leukocytosis Notable leukocytosis on arrival No evidence of concurrent fever No focal evidence of infection on exam Obtaining urinalysis and chest x-ray  Mild intermittent asthma without complication No evidence of acute asthma exacerbation As needed bronchodilator therapy for shortness of breath and wheezing.   Class 3 severe obesity due to excess calories without serious comorbidity with body mass index (BMI) of 50.0 to 59.9 in adult [facility](HCC) Counseling patient on caloric restriction and regular physical activity.         Code Status:  Full code  code status decision has been confirmed with: patient Family Communication: deferred   Consults: Dr. Thomasena Edisollins with Neurology  Severity of Illness:  The appropriate patient status for this patient is OBSERVATION. Observation status is judged to be reasonable and necessary in order  to provide the required intensity of service to ensure the patient's safety. The patient's presenting symptoms, physical exam findings, and initial radiographic and laboratory data in the context of their medical condition is felt to place them at decreased risk for further clinical deterioration. Furthermore, it is anticipated that the patient will be medically stable for discharge from the hospital within 2 midnights of admission.   Author:  Marinda Elk MD  08/08/2021 9:45 PM

## 2021-08-08 NOTE — ED Provider Notes (Signed)
[facility] EMERGENCY DEPARTMENT Provider Note   CSN: [id number] Arrival date & time: 08/08/21  1048     History  Chief Complaint  Patient presents with   Eye Problem    Stephanie Cohen is a 21 y.o. female.   Eye Problem  Patient initially noted acute changes in her vision a couple days ago.  Patient had noticed some eye redness but was also having intermittent vision loss in her left eye.  Symptoms lasted a few seconds and resolved.  She went to an urgent care and they discussed case with ophthalmology who recommended ED evaluation for further stroke work-up.  Patient declined and he went to an eye doctor today.  Dr. Lorie Phenix the patient needed full work-up including CT scan of the brain and orbit as well as LP with opening pressure and CNS analysis.  Home Medications Prior to Admission medications   Medication Sig Start Date End Date Taking? Authorizing Provider  diclofenac Sodium (VOLTAREN) 1 % GEL Apply 2 g topically 4 (four) times daily. 01/06/20   Cathie Hoops, Amy V, PA-C  tiZANidine (ZANAFLEX) 2 MG tablet Take 1 tablet (2 mg total) by mouth every 8 (eight) hours as needed for muscle spasms. 01/06/20   Belinda Fisher, PA-C      Allergies    Patient has no known allergies.    Review of Systems   Review of Systems  Constitutional:  Negative for fever.   Physical Exam Updated Vital Signs BP 119/65 (BP Location: Right Arm)   Pulse 79   Temp 98.2 F (36.8 C) (Oral)   Resp 18   Ht 1.626 m (5\' 4" )   Wt (!) 138.8 kg   LMP  (LMP Unknown)   SpO2 100%   BMI 52.52 kg/m  Physical Exam Vitals and nursing note reviewed.  Constitutional:      General: She is not in acute distress.    Appearance: She is well-developed.  HENT:     Head: Normocephalic and atraumatic.     Right Ear: External ear normal.     Left Ear: External ear normal.  Eyes:     General: No scleral icterus.       Right eye: No discharge.        Left eye: No discharge.     Conjunctiva/sclera:  Conjunctivae normal.     Pupils: Pupils are equal, round, and reactive to light.  Neck:     Trachea: No tracheal deviation.  Cardiovascular:     Rate and Rhythm: Normal rate and regular rhythm.  Pulmonary:     Effort: Pulmonary effort is normal. No respiratory distress.     Breath sounds: Normal breath sounds. No stridor. No wheezing or rales.  Abdominal:     General: Bowel sounds are normal. There is no distension.     Palpations: Abdomen is soft.     Tenderness: There is no abdominal tenderness. There is no guarding or rebound.  Musculoskeletal:        General: No tenderness or deformity.     Cervical back: Neck supple.  Skin:    General: Skin is warm and dry.     Findings: No rash.  Neurological:     General: No focal deficit present.     Mental Status: She is alert.     Cranial Nerves: No cranial nerve deficit (no facial droop, extraocular movements intact, no slurred speech).     Sensory: No sensory deficit.     Motor: No abnormal  muscle tone or seizure activity.     Coordination: Coordination normal.  Psychiatric:        Mood and Affect: Mood normal.    ED Results / Procedures / Treatments   Labs (all labs ordered are listed, but only abnormal results are displayed) Labs Reviewed  COMPREHENSIVE METABOLIC PANEL - Abnormal; Notable for the following components:      Result Value   Glucose, Bld 116 (*)    Total Bilirubin 0.2 (*)    All other components within normal limits  CBC WITH DIFFERENTIAL/PLATELET - Abnormal; Notable for the following components:   WBC 12.7 (*)    RBC 5.95 (*)    MCV 61.8 (*)    MCH 20.3 (*)    RDW 17.1 (*)    Lymphs Abs 4.2 (*)    All other components within normal limits  I-STAT BETA HCG BLOOD, ED ([facility], [facility], [facility] ONLY)    EKG None  Radiology CT Head Wo Contrast  Result Date: 08/08/2021 CLINICAL DATA:  Orbital trauma, left eye vision problems. EXAM: CT HEAD WITHOUT CONTRAST TECHNIQUE: Contiguous axial images were obtained from the base  of the skull through the vertex without intravenous contrast. RADIATION DOSE REDUCTION: This exam was performed according to the departmental dose-optimization program which includes automated exposure control, adjustment of the mA and/or kV according to patient size and/or use of iterative reconstruction technique. COMPARISON:  None Available. FINDINGS: Brain: No evidence of acute infarction, hemorrhage, hydrocephalus, extra-axial collection or mass lesion/mass effect. Vascular: No hyperdense vessel or unexpected calcification. Skull: Normal. Negative for fracture or focal lesion. Sinuses/Orbits: No acute finding. Other: None. IMPRESSION: No acute intracranial abnormality. Electronically Signed   By: Larose HiresImran  Ahmed D.O.   On: 08/08/2021 12:57   CT Orbits Wo Contrast  Result Date: 08/08/2021 CLINICAL DATA:  Uveitis or scleritis suspected EXAM: CT ORBITS WITHOUT CONTRAST TECHNIQUE: Multidetector CT imaging of the orbits was performed using the standard protocol without intravenous contrast. Multiplanar CT image reconstructions were also generated. RADIATION DOSE REDUCTION: This exam was performed according to the departmental dose-optimization program which includes automated exposure control, adjustment of the mA and/or kV according to patient size and/or use of iterative reconstruction technique. COMPARISON:  None Available. FINDINGS: Orbits: No orbital mass or evidence of inflammation. Strabismus. Otherwise, normal appearance of the globes, optic nerve-sheath complexes, extraocular muscles, orbital fat and lacrimal glands. Visible paranasal sinuses: Clear. Soft tissues: Normal. Osseous: No fracture or aggressive lesion. Limited intracranial: Evaluated on concurrent CT head. IMPRESSION: Strabismus. Otherwise, unremarkable CT of the orbits. MRI of the orbits with contrast could provide more sensitive evaluation if clinically warranted Electronically Signed   By: Feliberto HartsFrederick S Jones M.D.   On: 08/08/2021 13:02     Procedures Procedures    Medications Ordered in ED Medications - No data to display  ED Course/ Medical Decision Making/ A&P Clinical Course as of 08/08/21 1544  Tue Aug 08, 2021  1505 CBC with Differential(!) White blood cell count elevated [JK]  1531 Comprehensive metabolic panel(!) Normal [JK]  1531 I-Stat Beta hCG blood, ED ([facility], [facility], [facility] only) Pregnancy negative [JK]    Clinical Course User Index [JK] Linwood DibblesKnapp, Kairon Shock, MD                           Medical Decision Making Patient presents with visual changes.  Seen by ophthalmology concerning for possible idiopathic intracranial hypertension.  Optic neuritis is also a concern.    Problems Addressed: Visual  disturbance: acute illness or injury that poses a threat to life or bodily functions  Amount and/or Complexity of Data Reviewed Radiology: ordered.  Patient presented to the ED for evaluation of visual changes.  Patient seen by ophthalmology and sent to the ED for evaluation of idiopathic intracranial hypertension.  I spoken with neurology Dr. Thomasena Edis.  He will evaluate the patient in ED.  MRI imaging has been ordered.  Care turned over to Dr Elias Else.        Final Clinical Impression(s) / ED Diagnoses Final diagnoses:  Visual disturbance    Rx / DC Orders ED Discharge Orders     None         Linwood Dibbles, MD 08/08/21 1544

## 2021-08-08 NOTE — Assessment & Plan Note (Signed)
.   Counseling patient on caloric restriction and regular physical activity.

## 2021-08-09 ENCOUNTER — Observation Stay (HOSPITAL_COMMUNITY): Payer: 59

## 2021-08-09 DIAGNOSIS — G932 Benign intracranial hypertension: Secondary | ICD-10-CM | POA: Diagnosis present

## 2021-08-09 DIAGNOSIS — D72829 Elevated white blood cell count, unspecified: Secondary | ICD-10-CM

## 2021-08-09 DIAGNOSIS — H509 Unspecified strabismus: Secondary | ICD-10-CM | POA: Diagnosis present

## 2021-08-09 DIAGNOSIS — E282 Polycystic ovarian syndrome: Secondary | ICD-10-CM | POA: Diagnosis present

## 2021-08-09 DIAGNOSIS — H539 Unspecified visual disturbance: Secondary | ICD-10-CM | POA: Diagnosis present

## 2021-08-09 DIAGNOSIS — J4520 Mild intermittent asthma, uncomplicated: Secondary | ICD-10-CM | POA: Diagnosis present

## 2021-08-09 DIAGNOSIS — Z79899 Other long term (current) drug therapy: Secondary | ICD-10-CM | POA: Diagnosis not present

## 2021-08-09 DIAGNOSIS — Z6841 Body Mass Index (BMI) 40.0 and over, adult: Secondary | ICD-10-CM | POA: Diagnosis not present

## 2021-08-09 LAB — COMPREHENSIVE METABOLIC PANEL
ALT: 18 U/L (ref 0–44)
AST: 25 U/L (ref 15–41)
Albumin: 3.5 g/dL (ref 3.5–5.0)
Alkaline Phosphatase: 53 U/L (ref 38–126)
Anion gap: 4 — ABNORMAL LOW (ref 5–15)
BUN: 9 mg/dL (ref 6–20)
CO2: 25 mmol/L (ref 22–32)
Calcium: 9 mg/dL (ref 8.9–10.3)
Chloride: 108 mmol/L (ref 98–111)
Creatinine, Ser: 0.72 mg/dL (ref 0.44–1.00)
GFR, Estimated: >60 mL/min (ref >60)
Glucose, Bld: 107 mg/dL — ABNORMAL HIGH (ref 70–99)
Potassium: 3.8 mmol/L (ref 3.5–5.1)
Sodium: 137 mmol/L (ref 135–145)
Total Bilirubin: 0.3 mg/dL (ref 0.3–1.2)
Total Protein: 7.1 g/dL (ref 6.5–8.1)

## 2021-08-09 LAB — PROTIME-INR
INR: 1 (ref 0.8–1.2)
Prothrombin Time: 13.3 seconds (ref 11.4–15.2)

## 2021-08-09 LAB — CSF CELL COUNT WITH DIFFERENTIAL
Eosinophils, CSF: 0 % (ref 0–1)
Lymphs, CSF: 98 % — ABNORMAL HIGH (ref 40–80)
Monocyte-Macrophage-Spinal Fluid: 2 % — ABNORMAL LOW (ref 15–45)
RBC Count, CSF: 0 /mm3
Segmented Neutrophils-CSF: 0 % (ref 0–6)
Tube #: 3
WBC, CSF: 11 /mm3 (ref 0–5)

## 2021-08-09 LAB — CBC WITH DIFFERENTIAL/PLATELET
Abs Immature Granulocytes: 0.04 10*3/uL (ref 0.00–0.07)
Basophils Absolute: 0.1 10*3/uL (ref 0.0–0.1)
Basophils Relative: 1 %
Eosinophils Absolute: 0.3 10*3/uL (ref 0.0–0.5)
Eosinophils Relative: 3 %
HCT: 36.5 % (ref 36.0–46.0)
Hemoglobin: 11.9 g/dL — ABNORMAL LOW (ref 12.0–15.0)
Immature Granulocytes: 0 %
Lymphocytes Relative: 33 %
Lymphs Abs: 4.1 10*3/uL — ABNORMAL HIGH (ref 0.7–4.0)
MCH: 20.2 pg — ABNORMAL LOW (ref 26.0–34.0)
MCHC: 32.6 g/dL (ref 30.0–36.0)
MCV: 62 fL — ABNORMAL LOW (ref 80.0–100.0)
Monocytes Absolute: 0.7 10*3/uL (ref 0.1–1.0)
Monocytes Relative: 6 %
Neutro Abs: 7.4 10*3/uL (ref 1.7–7.7)
Neutrophils Relative %: 57 %
Platelets: 358 10*3/uL (ref 150–400)
RBC: 5.89 MIL/uL — ABNORMAL HIGH (ref 3.87–5.11)
RDW: 17 % — ABNORMAL HIGH (ref 11.5–15.5)
WBC: 12.6 10*3/uL — ABNORMAL HIGH (ref 4.0–10.5)
nRBC: 0 % (ref 0.0–0.2)

## 2021-08-09 LAB — HIV ANTIBODY (ROUTINE TESTING W REFLEX): HIV Screen 4th Generation wRfx: NONREACTIVE

## 2021-08-09 LAB — MAGNESIUM: Magnesium: 1.8 mg/dL (ref 1.7–2.4)

## 2021-08-09 LAB — GLUCOSE, CSF: Glucose, CSF: 48 mg/dL (ref 40–70)

## 2021-08-09 LAB — PROTEIN, CSF: Total  Protein, CSF: 20 mg/dL (ref 15–45)

## 2021-08-09 LAB — APTT: aPTT: 32 seconds (ref 24–36)

## 2021-08-09 MED ORDER — LIDOCAINE HCL (PF) 1 % IJ SOLN
5.0000 mL | Freq: Once | INTRAMUSCULAR | Status: AC
  Administered 2021-08-09: 5 mL via INTRADERMAL
  Filled 2021-08-09: qty 5

## 2021-08-09 MED ORDER — ACETAZOLAMIDE 250 MG PO TABS
500.0000 mg | ORAL_TABLET | Freq: Two times a day (BID) | ORAL | Status: DC
  Administered 2021-08-09 – 2021-08-10 (×3): 500 mg via ORAL
  Filled 2021-08-09 (×4): qty 2

## 2021-08-09 NOTE — Progress Notes (Addendum)
PROGRESS NOTE        PATIENT DETAILS Name: Stephanie Cohen Age: 21 y.o. Sex: female Date of Birth: August 20, 2000 Admit Date: 08/08/2021 Admitting Physician Marinda Elk, MD NUU:[id number], No Pcp Per (Inactive)  Brief Summary: Patient is a 21 y.o.  female with with history of PCOS, obesity-presented to the ED-referred by ophthalmologist for evaluation of papilledema and intermittent headaches.  She was evaluated by neurology-with concern for benign intracranial hypertension-subsequently admitted to the hospitalist service.   Significant events: 5/23>> referred to the ED by ophthalmology for evaluation of papilledema  Significant studies: 5/23>> CT head: No acute intracranial abnormality. 5/23>> CT orbits: Strabismus-unremarkable otherwise. 5/23>> MRI brain: No evidence of acute intracranial abnormality. 5/23>> MRI orbit: Question slight protrusion of the optic papula bilaterally. 5/23>> CXR: No PNA  Significant microbiology data: 5/24>> CSF culture: Pending  Procedures: 5/24>> fluoroscopy guided lumbar puncture-opening pressure 36  Consults: Neurology  Subjective: Lying comfortably in bed-denies any chest pain or shortness of breath.  No headaches this morning.  Objective: Vitals: Blood pressure 119/77, pulse 80, temperature 98 F (36.7 C), temperature source Oral, resp. rate 16, height 5\' 4"  (1.626 m), weight (!) 144.2 kg, last menstrual period 08/08/2021, SpO2 94 %.   Exam: Gen Exam:Alert awake-not in any distress HEENT:atraumatic, normocephalic Chest: B/L clear to auscultation anteriorly CVS:S1S2 regular Abdomen:soft non tender, non distended Extremities:no edema Neurology: Non focal Skin: no rash  Pertinent Labs/Radiology:    Latest Ref Rng & Units 08/09/2021    1:13 AM 08/08/2021   11:29 AM  CBC  WBC 4.0 - 10.5 K/uL 12.6   12.7    Hemoglobin 12.0 - 15.0 g/dL 08/10/2021   40.3    Hematocrit 36.0 - 46.0 % 36.5   36.8    Platelets 150  - 400 K/uL 358   366      Lab Results  Component Value Date   NA 137 08/09/2021   K 3.8 08/09/2021   CL 108 08/09/2021   CO2 25 08/09/2021      Assessment/Plan: Papilledema with elevated opening pressure on lumbar puncture-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  Continue efforts to encourage weight loss.  Addendum: Discussed with neurology-Dr. Collins-given severity of papilledema-plan is to reassess tomorrow morning-if she tolerates Diamox overnight-to go up on Diamox 750 mg twice daily and plan for discharge accordingly.  Mild leukocytosis: No foci of infection apparent-remains afebrile-nontoxic-appearing-follow.  Mild intermittent asthma: Stable-no flare-as needed bronchodilators.  Polycystic ovarian syndrome: Stable for outpatient follow-up with PCP  Morbid Obesity: Estimated body mass index is 54.58 kg/m as calculated from the following:   Height as of this encounter: 5\' 4"  (1.626 m).   Weight as of this encounter: 144.2 kg.   Code status:   Code Status: Full Code   DVT Prophylaxis: SCDs Start: 08/08/21 2141   Family Communication: Mother at bedside  Disposition Plan: Status is: Observation The patient will require care spanning > 2 midnights and should be moved to inpatient because: LP with significantly elevated opening pressure-starting Diamox-assess clinical response.   Planned Discharge Destination:Home likely on 5/25 if headache/papilledema better.   Diet: Diet Order             Diet regular Room service appropriate? Yes; Fluid consistency: Thin  Diet effective now  Antimicrobial agents: Anti-infectives (From admission, onward)    None        MEDICATIONS: Scheduled Meds:  acetaZOLAMIDE  500 mg Oral BID   Continuous Infusions: PRN Meds:.acetaminophen **OR** acetaminophen, albuterol, melatonin, ondansetron **OR** ondansetron  (ZOFRAN) IV, oxyCODONE-acetaminophen, polyethylene glycol   I have personally reviewed following labs and imaging studies  LABORATORY DATA: CBC: Recent Labs  Lab 08/08/21 1129 08/09/21 0113  WBC 12.7* 12.6*  NEUTROABS 7.3 7.4  HGB 12.1 11.9*  HCT 36.8 36.5  MCV 61.8* 62.0*  PLT 366 358    Basic Metabolic Panel: Recent Labs  Lab 08/08/21 1129 08/09/21 0113  NA 136 137  K 4.1 3.8  CL 104 108  CO2 23 25  GLUCOSE 116* 107*  BUN 10 9  CREATININE 0.76 0.72  CALCIUM 9.2 9.0  MG  --  1.8    GFR: Estimated Creatinine Clearance: 157.6 mL/min (by C-G formula based on SCr of 0.72 mg/dL).  Liver Function Tests: Recent Labs  Lab 08/08/21 1129 08/09/21 0113  AST 29 25  ALT 19 18  ALKPHOS 56 53  BILITOT 0.2* 0.3  PROT 7.3 7.1  ALBUMIN 3.9 3.5   No results for input(s): LIPASE, AMYLASE in the last 168 hours. No results for input(s): AMMONIA in the last 168 hours.  Coagulation Profile: Recent Labs  Lab 08/09/21 0113  INR 1.0    Cardiac Enzymes: No results for input(s): CKTOTAL, CKMB, CKMBINDEX, TROPONINI in the last 168 hours.  BNP (last 3 results) No results for input(s): PROBNP in the last 8760 hours.  Lipid Profile: No results for input(s): CHOL, HDL, LDLCALC, TRIG, CHOLHDL, LDLDIRECT in the last 72 hours.  Thyroid Function Tests: No results for input(s): TSH, T4TOTAL, FREET4, T3FREE, THYROIDAB in the last 72 hours.  Anemia Panel: No results for input(s): VITAMINB12, FOLATE, FERRITIN, TIBC, IRON, RETICCTPCT in the last 72 hours.  Urine analysis:    Component Value Date/Time   COLORURINE YELLOW 08/08/2021 2207   APPEARANCEUR CLEAR 08/08/2021 2207   LABSPEC 1.028 08/08/2021 2207   PHURINE 5.0 08/08/2021 2207   GLUCOSEU NEGATIVE 08/08/2021 2207   HGBUR NEGATIVE 08/08/2021 2207   BILIRUBINUR NEGATIVE 08/08/2021 2207   KETONESUR NEGATIVE 08/08/2021 2207   PROTEINUR NEGATIVE 08/08/2021 2207   NITRITE NEGATIVE 08/08/2021 2207   LEUKOCYTESUR NEGATIVE  08/08/2021 2207    Sepsis Labs: Lactic Acid, Venous No results found for: LATICACIDVEN  MICROBIOLOGY: No results found for this or any previous visit (from the past 240 hour(s)).  RADIOLOGY STUDIES/RESULTS: DG Chest 1 View  Result Date: 08/08/2021 CLINICAL DATA:  Pneumonia.  No chest complaints. EXAM: CHEST  1 VIEW COMPARISON:  None Available. FINDINGS: The heart size and mediastinal contours are within normal limits. Decreased lung volumes are noted. Both lungs are clear. The visualized skeletal structures are unremarkable. IMPRESSION: No active disease. Electronically Signed   By: Aram Candela M.D.   On: 08/08/2021 22:21   CT Head Wo Contrast  Result Date: 08/08/2021 CLINICAL DATA:  Orbital trauma, left eye vision problems. EXAM: CT HEAD WITHOUT CONTRAST TECHNIQUE: Contiguous axial images were obtained from the base of the skull through the vertex without intravenous contrast. RADIATION DOSE REDUCTION: This exam was performed according to the departmental dose-optimization program which includes automated exposure control, adjustment of the mA and/or kV according to patient size and/or use of iterative reconstruction technique. COMPARISON:  None Available. FINDINGS: Brain: No evidence of acute infarction, hemorrhage, hydrocephalus, extra-axial collection or mass lesion/mass effect. Vascular: No hyperdense vessel or unexpected calcification.  Skull: Normal. Negative for fracture or focal lesion. Sinuses/Orbits: No acute finding. Other: None. IMPRESSION: No acute intracranial abnormality. Electronically Signed   By: Larose HiresImran  Ahmed D.O.   On: 08/08/2021 12:57   MR BRAIN WO CONTRAST  Result Date: 08/08/2021 CLINICAL DATA:  Neuro deficit, acute, stroke suspected EXAM: MRI HEAD AND ORBITS WITHOUT CONTRAST TECHNIQUE: Multiplanar, multi-echo pulse sequences of the brain and surrounding structures were acquired without intravenous contrast. Multiplanar, multi-echo pulse sequences of the orbits and  surrounding structures were acquired including fat saturation techniques, without intravenous contrast administration. COMPARISON:  CT orbits from the same day. FINDINGS: MRI HEAD FINDINGS Brain: No acute infarction, hemorrhage, hydrocephalus, extra-axial collection or mass lesion. No pathologic enhancement. Vascular: Major arterial flow voids are maintained skull base. Skull and upper cervical spine: Normal marrow signal. Other: No mastoid effusions. MRI ORBITS FINDINGS Orbits: Strabismus. Question slight protrusion of the optic papilla bilaterally and mild prominence of the optic nerve sheaths. Otherwise, normal appearance of the globes, optic nerve-sheath complexes, extraocular muscles, orbital fat and lacrimal glands. No orbital mass or evidence of inflammation. No abnormal enhancement. Visualized sinuses: Clear. Soft tissues: Normal. IMPRESSION: MRI head: No evidence of acute intracranial abnormality. MRI of the orbits: 1. Question slight protrusion of the optic papilla bilaterally and mild prominence of the optic nerve sheaths, which is nonspecific but could potentially represent papilledema. Consider correlation with fundoscopic exam. 2. Strabismus. Electronically Signed   By: Feliberto HartsFrederick S Jones M.D.   On: 08/08/2021 18:19   DG FL GUIDED LUMBAR PUNCTURE  Result Date: 08/09/2021 CLINICAL DATA:  Complaint of headache and papilledema. Concern for possible idiopathic intracranial hypertension. EXAM: DIAGNOSTIC LUMBAR PUNCTURE UNDER FLUOROSCOPIC GUIDANCE COMPARISON:  None Available. FLUOROSCOPY: Radiation Exposure Index (as provided by the fluoroscopic device): 3 mGy Kerma PROCEDURE: Informed consent was obtained from the patient prior to the procedure, including potential complications of headache, allergy, and pain. With the patient prone, the lower back was prepped with Betadine. 1% Lidocaine was used for local anesthesia. Lumbar puncture was performed at the L3-L4 level using a 20g gauge 5 inch spinal  needle with return of clear, colorless CSF with an opening pressure of 36 cm water. A total of 18 ml of CSF were obtained for laboratory studies. The patient tolerated the procedure well and there were no apparent complications. IMPRESSION: Technically successful lumbar puncture from L3-L4 level as described above. Procedure performed by Brayton ElKevin Bruning PA-C, supervised by Dr. Paulina FusiMark Shogry Electronically Signed   By: Paulina FusiMark  Shogry M.D.   On: 08/09/2021 13:10   CT Orbits Wo Contrast  Result Date: 08/08/2021 CLINICAL DATA:  Uveitis or scleritis suspected EXAM: CT ORBITS WITHOUT CONTRAST TECHNIQUE: Multidetector CT imaging of the orbits was performed using the standard protocol without intravenous contrast. Multiplanar CT image reconstructions were also generated. RADIATION DOSE REDUCTION: This exam was performed according to the departmental dose-optimization program which includes automated exposure control, adjustment of the mA and/or kV according to patient size and/or use of iterative reconstruction technique. COMPARISON:  None Available. FINDINGS: Orbits: No orbital mass or evidence of inflammation. Strabismus. Otherwise, normal appearance of the globes, optic nerve-sheath complexes, extraocular muscles, orbital fat and lacrimal glands. Visible paranasal sinuses: Clear. Soft tissues: Normal. Osseous: No fracture or aggressive lesion. Limited intracranial: Evaluated on concurrent CT head. IMPRESSION: Strabismus. Otherwise, unremarkable CT of the orbits. MRI of the orbits with contrast could provide more sensitive evaluation if clinically warranted Electronically Signed   By: Feliberto HartsFrederick S Jones M.D.   On: 08/08/2021 13:02   MR  ORBITS W WO CONTRAST  Result Date: 08/08/2021 CLINICAL DATA:  Neuro deficit, acute, stroke suspected EXAM: MRI HEAD AND ORBITS WITHOUT CONTRAST TECHNIQUE: Multiplanar, multi-echo pulse sequences of the brain and surrounding structures were acquired without intravenous contrast.  Multiplanar, multi-echo pulse sequences of the orbits and surrounding structures were acquired including fat saturation techniques, without intravenous contrast administration. COMPARISON:  CT orbits from the same day. FINDINGS: MRI HEAD FINDINGS Brain: No acute infarction, hemorrhage, hydrocephalus, extra-axial collection or mass lesion. No pathologic enhancement. Vascular: Major arterial flow voids are maintained skull base. Skull and upper cervical spine: Normal marrow signal. Other: No mastoid effusions. MRI ORBITS FINDINGS Orbits: Strabismus. Question slight protrusion of the optic papilla bilaterally and mild prominence of the optic nerve sheaths. Otherwise, normal appearance of the globes, optic nerve-sheath complexes, extraocular muscles, orbital fat and lacrimal glands. No orbital mass or evidence of inflammation. No abnormal enhancement. Visualized sinuses: Clear. Soft tissues: Normal. IMPRESSION: MRI head: No evidence of acute intracranial abnormality. MRI of the orbits: 1. Question slight protrusion of the optic papilla bilaterally and mild prominence of the optic nerve sheaths, which is nonspecific but could potentially represent papilledema. Consider correlation with fundoscopic exam. 2. Strabismus. Electronically Signed   By: Feliberto Harts M.D.   On: 08/08/2021 18:19     LOS: 0 days   Jeoffrey Massed, MD  [facility]    To contact the attending provider between 7A-7P or the covering provider during after hours 7P-7A, please log into the web site [email or web address] and access using universal [place] password for that web site. If you do not have the password, please call the hospital operator.  08/09/2021, 2:27 PM

## 2021-08-09 NOTE — Procedures (Signed)
Successful image guided LP from L3-L4. Clear colorless CSF Opening pressure 36 18 mL total removed.  No complications.  Brayton El PA-C Interventional Radiology 08/09/2021 12:55 PM

## 2021-08-09 NOTE — Care Management (Signed)
  Transition of Care [facility]) Screening Note   Patient Details  Name: SEATTLE DALPORTO Date of Birth: 02-13-2001   [facility] CM/SW Contact:    Lawerance Sabal, RN Phone Number: 08/09/2021, 7:46 AM    Transition of Care Department [facility]) has reviewed patient and no TOC needs have been identified at this time. We will continue to monitor patient advancement through interdisciplinary progression rounds. If new patient transition needs arise, please place a TOC consult.

## 2021-08-10 ENCOUNTER — Inpatient Hospital Stay (HOSPITAL_COMMUNITY): Payer: 59

## 2021-08-10 DIAGNOSIS — Z6841 Body Mass Index (BMI) 40.0 and over, adult: Secondary | ICD-10-CM

## 2021-08-10 DIAGNOSIS — G932 Benign intracranial hypertension: Secondary | ICD-10-CM | POA: Diagnosis not present

## 2021-08-10 DIAGNOSIS — H539 Unspecified visual disturbance: Secondary | ICD-10-CM | POA: Diagnosis not present

## 2021-08-10 LAB — BASIC METABOLIC PANEL
Anion gap: 4 — ABNORMAL LOW (ref 5–15)
BUN: 11 mg/dL (ref 6–20)
CO2: 20 mmol/L — ABNORMAL LOW (ref 22–32)
Calcium: 8.8 mg/dL — ABNORMAL LOW (ref 8.9–10.3)
Chloride: 110 mmol/L (ref 98–111)
Creatinine, Ser: 0.79 mg/dL (ref 0.44–1.00)
GFR, Estimated: >60 mL/min (ref >60)
Glucose, Bld: 87 mg/dL (ref 70–99)
Potassium: 3.7 mmol/L (ref 3.5–5.1)
Sodium: 134 mmol/L — ABNORMAL LOW (ref 135–145)

## 2021-08-10 LAB — PATHOLOGIST SMEAR REVIEW

## 2021-08-10 MED ORDER — ACETAZOLAMIDE 250 MG PO TABS: 500.0000 mg | ORAL_TABLET | Freq: Two times a day (BID) | ORAL | 2 refills | Status: DC

## 2021-08-10 MED ORDER — IOHEXOL 350 MG/ML SOLN
80.0000 mL | Freq: Once | INTRAVENOUS | Status: AC | PRN
  Administered 2021-08-10: 80 mL via INTRAVENOUS

## 2021-08-10 NOTE — Progress Notes (Signed)
Patient discharging home. Vital signs stable at time of discharge. Discharge instructions given and verbal understanding returned. No questions at this time. 

## 2021-08-10 NOTE — Progress Notes (Signed)
Discussed with Dr. Durene Cal Collins-neurology MD on-call-no need for any further CSF studies-as clinical scenario is not consistent with infection.  Plans were for discharge-however he felt that we needed to do a CT venogram to rule out dural venous thrombosis prior to her discharge.  This was done-there is no evidence of dural venous thrombosis on CTV.  I reached out to Dr. Thomasena Edis again a little while ago-okay to discharge-continue current dosing of Diamox-we will need outpatient referral with neurology.  I have asked case management to see if we can get her a referral to weight loss clinic as well.  See discharge summary for details.

## 2021-08-10 NOTE — Progress Notes (Addendum)
Neurology Progress Note Stephanie Cohen MR# [id number] 08/10/2021   S: no overnight events; no new complaints.  O: Current vital signs: BP (!) 113/55 (BP Location: Left Arm)   Pulse 67   Temp (!) 97.5 F (36.4 C) (Oral)   Resp 17   Ht 5\' 4"  (1.626 m)   Wt (!) 144.2 kg   LMP 08/08/2021   SpO2 92%   BMI 54.58 kg/m  Vital signs in last 24 hours: Temp:  [97.5 F (36.4 C)-98.3 F (36.8 C)] 97.5 F (36.4 C) (05/25 0834) Pulse Rate:  [67-89] 67 (05/25 0834) Resp:  [15-17] 17 (05/25 0834) BP: (96-119)/(54-76) 113/55 (05/25 0834) SpO2:  [92 %-98 %] 92 % (05/25 0834) GENERAL: Awake, alert in NAD HEENT: Normocephalic and atraumatic, moist mm, no LN++, no thyromegaly LUNGS: symmetric excursions bilaterally with no audible wheezes. CV: RR, equal pulses bilaterally. ABDOMEN: Soft, nontender, nondistended with normoactive BS Ext: warm, well perfused, intact peripheral pulses  NEURO:  Mental Status: AA&Ox3  Language: speech is   Intact naming, repetition, and comprehension. PERR. EOMI, visual fields full, no facial asymmetry, facial sensation intact, hearing intact. No evidence of tongue atrophy or fibrillations, tongue/uvula/soft palate midline elevates symmetrically  Normal sternocleidomastoid and trapezius muscle strength. Motor:  strength in all extremities. Tone: Tone and bulk is normal Sensation: Intact to light touch bilaterally Coordination: FTN intact bilaterally, no ataxia in BLE. Gait - Deferred  Labs CSF FL LP at L3-L4 with OP 36 and 18 mL CSF removed.   08/09/21   Appearance, CSF CLEAR  CLEAR  Glucose, CSF 40 - 70 mg/dL 48  RBC Count, CSF 0 /cu mm 0  WBC, CSF 0 - 5 /cu mm 11 (HH)  Segmented Neutrophils-CSF 0 - 6 % 0  Lymphs, CSF 40 - 80 % 98 (H)  Monocyte-Macrophage-Spinal Fluid 15 - 45 % 2 (L)  Eosinophils, CSF 0 - 1 % 0  Color, CSF COLORLESS  COLORLESS  Supernatant  NOT INDICATED  Total  Protein, CSF 15 - 45 mg/dL 20  Tube #  3  (HH): Data is critically  high (H): Data is abnormally high (L): Data is abnormally low  Imaging I have reviewed images in epic and the results pertinent to this consultation are: CTV Head showed superior sagittal sinus, straight sinus, vein of Galen, and internal cerebral veins are patent. Transverse and sigmoid sinuses are patent. The right transverse sinus is dominant. No thrombus or significant stenosis identified   Assessment: Stephanie Cohen is a 21 y.o. female PMHx as noted above admitted for headache with characteristics of IIH. Venogram was ordered to rule out other potential pathologies and since MRV on a 1.5 T scanner yields 30% false positive CTV was preferred which also has faster acquisition times.   CSF showed mild pleocytosis and idiopathic intracranial hypertension can thought rare IIH may present with persistently abnormal CSF and high white cell count. She did meet criteria for the diagnosis of IIH except for CSF pleocytosis however because she presented with headache, papilledema without focal neurologic signs or systemic symptoms or signs of an infectious or inflammatory process, other significant lab abnormalities and neuroimaging did not suggest other intracranial abnormality and treatment for IIH was continued since she was tolerating acetazolamide, recommend continuing acetazolamide 500mg  bid, no need to monitor serum potassium and periodic follow up with ophthalmology to monitor vision.  We also discussed weight loss and recommended target weight of 143 kg.  She also had symptoms suggesting probable migraine which is very  often comorbid with IIH and strongly recommend on discharge that she be referred to Dr. Naomie Dean at [facility] neurology who is without question, the most talented and most superbly esteemed headache specialist in the area.  Please call for questions.   Electronically signed by:  Marisue Humble, MD Page: [phone] 08/10/2021, 9:05 AM  If 7pm- 7am, please page  neurology on call as listed in AMION.

## 2021-08-10 NOTE — TOC Transition Note (Signed)
Transition of Care [facility]) - CM/SW Discharge Note   Patient Details  Name: Stephanie Cohen MRN: [id number] Date of Birth: 06-Sep-2000  Transition of Care [facility]) CM/SW Contact:  Lawerance Sabal, RN Phone Number: 08/10/2021, 12:05 PM   Clinical Narrative:   [facility]. They will call her in the next few days to do an intake assessment over the schedule an appointment. Patient updated and knows to expect the call.           Patient Goals and CMS Choice        Discharge Placement                       Discharge Plan and Services                                     Social Determinants of Health (SDOH) Interventions     Readmission Risk Interventions     View : No data to display.

## 2021-08-10 NOTE — Significant Event (Signed)
Patient's nurse notified me that patient's CSF fluid did show 11 WBC.  Protein was 20.  Patient is afebrile.  I did discuss with patient's primary attending Dr. Geanie Cooley.  At this time patient's symptoms concerning for more of idiopathic intracranial hypertension.  So no antibiotics or antivirals started at this time.  We will continue to observe.  Midge Minium

## 2021-08-10 NOTE — Discharge Summary (Signed)
PATIENT DETAILS Name: Stephanie Cohen Age: 21 y.o. Sex: female Date of Birth: [birth year] MRN: [id number]. Admitting Physician: Maretta Bees, MD NWG:[id number], No Pcp Per (Inactive)  Admit Date: 08/08/2021 Discharge date: 08/10/2021  Recommendations for Outpatient Follow-up:  Follow up with PCP in 1-2 weeks Please obtain CMP/CBC in one week Please ensure follow up with neurology and with the weight loss clinic.  Admitted From:  Home  Disposition: Home   Discharge Condition: good  CODE STATUS:   Code Status: Full Code   Diet recommendation:  Diet Order             Diet general           Diet regular Room service appropriate? Yes; Fluid consistency: Thin  Diet effective now                    Brief Summary: Patient is a 21 y.o.  female with with history of PCOS, obesity-presented to the ED-referred by ophthalmologist for evaluation of papilledema and intermittent headaches.  She was evaluated by neurology-with concern for benign intracranial hypertension-subsequently admitted to the hospitalist service.    Significant events: 5/23>> referred to the ED by ophthalmology for evaluation of papilledema   Significant studies: 5/23>> CT head: No acute intracranial abnormality. 5/23>> CT orbits: Strabismus-unremarkable otherwise. 5/23>> MRI brain: No evidence of acute intracranial abnormality. 5/23>> MRI orbit: Question slight protrusion of the optic papula bilaterally. 5/23>> CXR: No PNA 5/25>> CT venogram: No evidence of venous sinus thrombosis   Significant microbiology data: 5/24>> CSF culture: No growth   Procedures: 5/24>> fluoroscopy guided lumbar puncture-opening pressure 36   Consults: Neurology  Brief Hospital Course: Papilledema with elevated opening pressure on lumbar puncture-likely idiopathic intracranial hypertension: Vision unchanged-continues to have intermittent headaches-underwent work-up as above-opening pressure on lumbar puncture  was 36.  Case discussed with Dr. Durene Cal Collins-neurology on-call-continue acetazolamide-patient will need to be referred to outpatient neurology.  Have discussed with case management-they will try to get patient a referral to weight loss clinic.  Patient was encouraged not to use any over-the-counter medication/oral contraceptives/tetracycline antibiotics-as these could potentially worsen idiopathic intracranial hypertension-she was asked to touch base with her neurologist before starting any medications including prescription/over-the-counter medications..  She is aware of the recommendations/importance of weight loss in this situation.  Mild leukocytosis: No foci of infection apparent-remains afebrile-nontoxic-appearing-monitor off antimicrobial therapy.   Mild intermittent asthma: Stable-no flare-as needed bronchodilators.   Polycystic ovarian syndrome: Stable for outpatient follow-up with PCP  Discharge Diagnoses:  Principal Problem:   Idiopathic intracranial hypertension Active Problems:   Mild intermittent asthma without complication   Leukocytosis   Class 3 severe obesity due to excess calories without serious comorbidity with body mass index (BMI) of 50.0 to 59.9 in adult [facility])   Pseudotumor cerebri   Discharge Instructions:  Activity:  As tolerated with Full fall precautions use walker/cane & assistance as needed   Discharge Instructions     Ambulatory referral to Neurology   Complete by: As directed    An appointment is requested in approximately: 2 weeks   Call MD for:  persistant nausea and vomiting   Complete by: As directed    Call MD for:  severe uncontrolled pain   Complete by: As directed    Diet general   Complete by: As directed    Discharge instructions   Complete by: As directed    Follow with Primary MD  in 1-2 weeks  A electronic referral has been sent  to neurology-you should be getting a call from them-if you do not hear from them-please give them a  call.  Electronic referral has been sent to weight loss clinic-you should be getting a call from them-if you do not hear from them-please give them a call.  Please get a complete blood count and chemistry panel checked by your Primary MD at your next visit, and again as instructed by your Primary MD.  Get Medicines reviewed and adjusted: Please take all your medications with you for your next visit with your Primary MD  Laboratory/radiological data: Please request your Primary MD to go over all hospital tests and procedure/radiological results at the follow up, please ask your Primary MD to get all Hospital records sent to his/her office.  In some cases, they will be blood work, cultures and biopsy results pending at the time of your discharge. Please request that your primary care M.D. follows up on these results.  Also Note the following: If you experience worsening of your admission symptoms, develop shortness of breath, life threatening emergency, suicidal or homicidal thoughts you must seek medical attention immediately by calling 911 or calling your MD immediately  if symptoms less severe.  You must read complete instructions/literature along with all the possible adverse reactions/side effects for all the Medicines you take and that have been prescribed to you. Take any new Medicines after you have completely understood and accpet all the possible adverse reactions/side effects.   Do not drive when taking Pain medications or sleeping medications (Benzodaizepines)  Do not take more than prescribed Pain, Sleep and Anxiety Medications. It is not advisable to combine anxiety,sleep and pain medications without talking with your primary care practitioner  Special Instructions: If you have smoked or chewed Tobacco  in the last 2 yrs please stop smoking, stop any regular Alcohol  and or any Recreational drug use.  Wear Seat belts while driving.  Please note: You were cared for by a  hospitalist during your hospital stay. Once you are discharged, your primary care physician will handle any further medical issues. Please note that NO REFILLS for any discharge medications will be authorized once you are discharged, as it is imperative that you return to your primary care physician (or establish a relationship with a primary care physician if you do not have one) for your post hospital discharge needs so that they can reassess your need for medications and monitor your lab values.   Increase activity slowly   Complete by: As directed       Allergies as of 08/10/2021   No Known Allergies      Medication List     STOP taking these medications    diclofenac Sodium 1 % Gel Commonly known as: Voltaren   tiZANidine 2 MG tablet Commonly known as: ZANAFLEX       TAKE these medications    acetaZOLAMIDE 250 MG tablet Commonly known as: DIAMOX Take 2 tablets (500 mg total) by mouth 2 (two) times daily.   cetirizine 10 MG tablet Commonly known as: ZYRTEC Take 10 mg by mouth 2 (two) times daily.        Follow-up Information     [facility] Follow up.   Why: [phone] They will be calling you Monday to do a new patient assessment over the phone and schedule you for an appointment Contact information: [address] [place] [id number]        [facility] Follow up.  Specialty: Neurology Why: Office will call with date/time, If you dont hear from them,please give them a call Contact information: [address] [place] [address] [phone]               No Known Allergies   Other Procedures/Studies: DG Chest 1 View  Result Date: 08/08/2021 CLINICAL DATA:  Pneumonia.  No chest complaints. EXAM: CHEST  1 VIEW COMPARISON:  None Available. FINDINGS: The heart size and mediastinal contours are within normal limits. Decreased lung volumes are noted. Both lungs  are clear. The visualized skeletal structures are unremarkable. IMPRESSION: No active disease. Electronically Signed   By: Aram Candela M.D.   On: 08/08/2021 22:21   CT Head Wo Contrast  Result Date: 08/08/2021 CLINICAL DATA:  Orbital trauma, left eye vision problems. EXAM: CT HEAD WITHOUT CONTRAST TECHNIQUE: Contiguous axial images were obtained from the base of the skull through the vertex without intravenous contrast. RADIATION DOSE REDUCTION: This exam was performed according to the departmental dose-optimization program which includes automated exposure control, adjustment of the mA and/or kV according to patient size and/or use of iterative reconstruction technique. COMPARISON:  None Available. FINDINGS: Brain: No evidence of acute infarction, hemorrhage, hydrocephalus, extra-axial collection or mass lesion/mass effect. Vascular: No hyperdense vessel or unexpected calcification. Skull: Normal. Negative for fracture or focal lesion. Sinuses/Orbits: No acute finding. Other: None. IMPRESSION: No acute intracranial abnormality. Electronically Signed   By: Larose Hires D.O.   On: 08/08/2021 12:57   MR BRAIN WO CONTRAST  Result Date: 08/08/2021 CLINICAL DATA:  Neuro deficit, acute, stroke suspected EXAM: MRI HEAD AND ORBITS WITHOUT CONTRAST TECHNIQUE: Multiplanar, multi-echo pulse sequences of the brain and surrounding structures were acquired without intravenous contrast. Multiplanar, multi-echo pulse sequences of the orbits and surrounding structures were acquired including fat saturation techniques, without intravenous contrast administration. COMPARISON:  CT orbits from the same day. FINDINGS: MRI HEAD FINDINGS Brain: No acute infarction, hemorrhage, hydrocephalus, extra-axial collection or mass lesion. No pathologic enhancement. Vascular: Major arterial flow voids are maintained skull base. Skull and upper cervical spine: Normal marrow signal. Other: No mastoid effusions. MRI ORBITS FINDINGS  Orbits: Strabismus. Question slight protrusion of the optic papilla bilaterally and mild prominence of the optic nerve sheaths. Otherwise, normal appearance of the globes, optic nerve-sheath complexes, extraocular muscles, orbital fat and lacrimal glands. No orbital mass or evidence of inflammation. No abnormal enhancement. Visualized sinuses: Clear. Soft tissues: Normal. IMPRESSION: MRI head: No evidence of acute intracranial abnormality. MRI of the orbits: 1. Question slight protrusion of the optic papilla bilaterally and mild prominence of the optic nerve sheaths, which is nonspecific but could potentially represent papilledema. Consider correlation with fundoscopic exam. 2. Strabismus. Electronically Signed   By: Feliberto Harts M.D.   On: 08/08/2021 18:19   CT VENOGRAM HEAD  Result Date: 08/10/2021 CLINICAL DATA:  Dural venous sinus thrombosis suspected; headache with IIH and mildly elevated CSF WBC (11) EXAM: CT VENOGRAM HEAD TECHNIQUE: Noncontrast CT of the head was performed using standard department protocol. Venographic phase images of the brain were obtained following the administration of intravenous contrast. Multiplanar reformats and maximum intensity projections were generated. RADIATION DOSE REDUCTION: This exam was performed according to the departmental dose-optimization program which includes automated exposure control, adjustment of the mA and/or kV according to patient size and/or use of iterative reconstruction technique. CONTRAST:  80mL OMNIPAQUE IOHEXOL 350 MG/ML SOLN COMPARISON:  None Available. FINDINGS: CT HEAD: Brain: There is no acute intracranial hemorrhage, mass effect, or  edema. Gray-white differentiation is preserved. There is no extra-axial fluid collection. Ventricles and sulci are within normal limits in size and configuration. On postcontrast venogram images, there is no abnormal enhancement. Vascular: No hyperdense vessel. Skull: Calvarium is unremarkable. Sinuses/Orbits:  No acute finding. Other: Mastoid air cells are clear. Sella is obscured by streak artifact. CTV HEAD: Superior sagittal sinus, straight sinus, vein of Galen, and internal cerebral veins are patent. Transverse and sigmoid sinuses are patent. The right transverse sinus is dominant. No thrombus or significant stenosis identified. IMPRESSION: No acute intracranial abnormality. No dural venous sinus thrombosis. Electronically Signed   By: Guadlupe Spanish M.D.   On: 08/10/2021 10:58   DG FL GUIDED LUMBAR PUNCTURE  Result Date: 08/09/2021 CLINICAL DATA:  Complaint of headache and papilledema. Concern for possible idiopathic intracranial hypertension. EXAM: DIAGNOSTIC LUMBAR PUNCTURE UNDER FLUOROSCOPIC GUIDANCE COMPARISON:  None Available. FLUOROSCOPY: Radiation Exposure Index (as provided by the fluoroscopic device): 3 mGy Kerma PROCEDURE: Informed consent was obtained from the patient prior to the procedure, including potential complications of headache, allergy, and pain. With the patient prone, the lower back was prepped with Betadine. 1% Lidocaine was used for local anesthesia. Lumbar puncture was performed at the L3-L4 level using a 20g gauge 5 inch spinal needle with return of clear, colorless CSF with an opening pressure of 36 cm water. A total of 18 ml of CSF were obtained for laboratory studies. The patient tolerated the procedure well and there were no apparent complications. IMPRESSION: Technically successful lumbar puncture from L3-L4 level as described above. Procedure performed by Brayton El PA-C, supervised by Dr. Paulina Fusi Electronically Signed   By: Paulina Fusi M.D.   On: 08/09/2021 13:10   CT Orbits Wo Contrast  Result Date: 08/08/2021 CLINICAL DATA:  Uveitis or scleritis suspected EXAM: CT ORBITS WITHOUT CONTRAST TECHNIQUE: Multidetector CT imaging of the orbits was performed using the standard protocol without intravenous contrast. Multiplanar CT image reconstructions were also generated.  RADIATION DOSE REDUCTION: This exam was performed according to the departmental dose-optimization program which includes automated exposure control, adjustment of the mA and/or kV according to patient size and/or use of iterative reconstruction technique. COMPARISON:  None Available. FINDINGS: Orbits: No orbital mass or evidence of inflammation. Strabismus. Otherwise, normal appearance of the globes, optic nerve-sheath complexes, extraocular muscles, orbital fat and lacrimal glands. Visible paranasal sinuses: Clear. Soft tissues: Normal. Osseous: No fracture or aggressive lesion. Limited intracranial: Evaluated on concurrent CT head. IMPRESSION: Strabismus. Otherwise, unremarkable CT of the orbits. MRI of the orbits with contrast could provide more sensitive evaluation if clinically warranted Electronically Signed   By: Feliberto Harts M.D.   On: 08/08/2021 13:02   MR ORBITS W WO CONTRAST  Result Date: 08/08/2021 CLINICAL DATA:  Neuro deficit, acute, stroke suspected EXAM: MRI HEAD AND ORBITS WITHOUT CONTRAST TECHNIQUE: Multiplanar, multi-echo pulse sequences of the brain and surrounding structures were acquired without intravenous contrast. Multiplanar, multi-echo pulse sequences of the orbits and surrounding structures were acquired including fat saturation techniques, without intravenous contrast administration. COMPARISON:  CT orbits from the same day. FINDINGS: MRI HEAD FINDINGS Brain: No acute infarction, hemorrhage, hydrocephalus, extra-axial collection or mass lesion. No pathologic enhancement. Vascular: Major arterial flow voids are maintained skull base. Skull and upper cervical spine: Normal marrow signal. Other: No mastoid effusions. MRI ORBITS FINDINGS Orbits: Strabismus. Question slight protrusion of the optic papilla bilaterally and mild prominence of the optic nerve sheaths. Otherwise, normal appearance of the globes, optic nerve-sheath complexes, extraocular muscles, orbital fat  and lacrimal  glands. No orbital mass or evidence of inflammation. No abnormal enhancement. Visualized sinuses: Clear. Soft tissues: Normal. IMPRESSION: MRI head: No evidence of acute intracranial abnormality. MRI of the orbits: 1. Question slight protrusion of the optic papilla bilaterally and mild prominence of the optic nerve sheaths, which is nonspecific but could potentially represent papilledema. Consider correlation with fundoscopic exam. 2. Strabismus. Electronically Signed   By: Feliberto Harts M.D.   On: 08/08/2021 18:19     TODAY-DAY OF DISCHARGE:  Subjective:   Elveta Rape today has no headache,no chest abdominal pain,no new weakness tingling or numbness, feels much better wants to go home today.   Objective:   Blood pressure 95/64, pulse 66, temperature (!) 97.5 F (36.4 C), temperature source Oral, resp. rate 17, height 5\' 4"  (1.626 m), weight (!) 144.2 kg, last menstrual period 08/08/2021, SpO2 98 %. No intake or output data in the 24 hours ending 08/10/21 1203 Filed Weights   08/08/21 1121 08/08/21 2218  Weight: (!) 138.8 kg (!) 144.2 kg    Exam: Awake Alert, Oriented *3, No new F.N deficits, Normal affect [place].AT,PERRAL Supple Neck,No JVD, No cervical lymphadenopathy appriciated.  Symmetrical Chest wall movement, Good air movement bilaterally, CTAB RRR,No Gallops,Rubs or new Murmurs, No Parasternal Heave +ve B.Sounds, Abd Soft, Non tender, No organomegaly appriciated, No rebound -guarding or rigidity. No Cyanosis, Clubbing or edema, No new Rash or bruise   PERTINENT RADIOLOGIC STUDIES: DG Chest 1 View  Result Date: 08/08/2021 CLINICAL DATA:  Pneumonia.  No chest complaints. EXAM: CHEST  1 VIEW COMPARISON:  None Available. FINDINGS: The heart size and mediastinal contours are within normal limits. Decreased lung volumes are noted. Both lungs are clear. The visualized skeletal structures are unremarkable. IMPRESSION: No active disease. Electronically Signed   By: 08/10/2021 M.D.   On: 08/08/2021 22:21   CT Head Wo Contrast  Result Date: 08/08/2021 CLINICAL DATA:  Orbital trauma, left eye vision problems. EXAM: CT HEAD WITHOUT CONTRAST TECHNIQUE: Contiguous axial images were obtained from the base of the skull through the vertex without intravenous contrast. RADIATION DOSE REDUCTION: This exam was performed according to the departmental dose-optimization program which includes automated exposure control, adjustment of the mA and/or kV according to patient size and/or use of iterative reconstruction technique. COMPARISON:  None Available. FINDINGS: Brain: No evidence of acute infarction, hemorrhage, hydrocephalus, extra-axial collection or mass lesion/mass effect. Vascular: No hyperdense vessel or unexpected calcification. Skull: Normal. Negative for fracture or focal lesion. Sinuses/Orbits: No acute finding. Other: None. IMPRESSION: No acute intracranial abnormality. Electronically Signed   By: 08/10/2021 D.O.   On: 08/08/2021 12:57   MR BRAIN WO CONTRAST  Result Date: 08/08/2021 CLINICAL DATA:  Neuro deficit, acute, stroke suspected EXAM: MRI HEAD AND ORBITS WITHOUT CONTRAST TECHNIQUE: Multiplanar, multi-echo pulse sequences of the brain and surrounding structures were acquired without intravenous contrast. Multiplanar, multi-echo pulse sequences of the orbits and surrounding structures were acquired including fat saturation techniques, without intravenous contrast administration. COMPARISON:  CT orbits from the same day. FINDINGS: MRI HEAD FINDINGS Brain: No acute infarction, hemorrhage, hydrocephalus, extra-axial collection or mass lesion. No pathologic enhancement. Vascular: Major arterial flow voids are maintained skull base. Skull and upper cervical spine: Normal marrow signal. Other: No mastoid effusions. MRI ORBITS FINDINGS Orbits: Strabismus. Question slight protrusion of the optic papilla bilaterally and mild prominence of the optic nerve sheaths. Otherwise,  normal appearance of the globes, optic nerve-sheath complexes, extraocular muscles, orbital fat and lacrimal glands. No orbital  mass or evidence of inflammation. No abnormal enhancement. Visualized sinuses: Clear. Soft tissues: Normal. IMPRESSION: MRI head: No evidence of acute intracranial abnormality. MRI of the orbits: 1. Question slight protrusion of the optic papilla bilaterally and mild prominence of the optic nerve sheaths, which is nonspecific but could potentially represent papilledema. Consider correlation with fundoscopic exam. 2. Strabismus. Electronically Signed   By: Feliberto Harts M.D.   On: 08/08/2021 18:19   CT VENOGRAM HEAD  Result Date: 08/10/2021 CLINICAL DATA:  Dural venous sinus thrombosis suspected; headache with IIH and mildly elevated CSF WBC (11) EXAM: CT VENOGRAM HEAD TECHNIQUE: Noncontrast CT of the head was performed using standard department protocol. Venographic phase images of the brain were obtained following the administration of intravenous contrast. Multiplanar reformats and maximum intensity projections were generated. RADIATION DOSE REDUCTION: This exam was performed according to the departmental dose-optimization program which includes automated exposure control, adjustment of the mA and/or kV according to patient size and/or use of iterative reconstruction technique. CONTRAST:  80mL OMNIPAQUE IOHEXOL 350 MG/ML SOLN COMPARISON:  None Available. FINDINGS: CT HEAD: Brain: There is no acute intracranial hemorrhage, mass effect, or edema. Gray-white differentiation is preserved. There is no extra-axial fluid collection. Ventricles and sulci are within normal limits in size and configuration. On postcontrast venogram images, there is no abnormal enhancement. Vascular: No hyperdense vessel. Skull: Calvarium is unremarkable. Sinuses/Orbits: No acute finding. Other: Mastoid air cells are clear. Sella is obscured by streak artifact. CTV HEAD: Superior sagittal sinus, straight  sinus, vein of Galen, and internal cerebral veins are patent. Transverse and sigmoid sinuses are patent. The right transverse sinus is dominant. No thrombus or significant stenosis identified. IMPRESSION: No acute intracranial abnormality. No dural venous sinus thrombosis. Electronically Signed   By: Guadlupe Spanish M.D.   On: 08/10/2021 10:58   DG FL GUIDED LUMBAR PUNCTURE  Result Date: 08/09/2021 CLINICAL DATA:  Complaint of headache and papilledema. Concern for possible idiopathic intracranial hypertension. EXAM: DIAGNOSTIC LUMBAR PUNCTURE UNDER FLUOROSCOPIC GUIDANCE COMPARISON:  None Available. FLUOROSCOPY: Radiation Exposure Index (as provided by the fluoroscopic device): 3 mGy Kerma PROCEDURE: Informed consent was obtained from the patient prior to the procedure, including potential complications of headache, allergy, and pain. With the patient prone, the lower back was prepped with Betadine. 1% Lidocaine was used for local anesthesia. Lumbar puncture was performed at the L3-L4 level using a 20g gauge 5 inch spinal needle with return of clear, colorless CSF with an opening pressure of 36 cm water. A total of 18 ml of CSF were obtained for laboratory studies. The patient tolerated the procedure well and there were no apparent complications. IMPRESSION: Technically successful lumbar puncture from L3-L4 level as described above. Procedure performed by Brayton El PA-C, supervised by Dr. Paulina Fusi Electronically Signed   By: Paulina Fusi M.D.   On: 08/09/2021 13:10   CT Orbits Wo Contrast  Result Date: 08/08/2021 CLINICAL DATA:  Uveitis or scleritis suspected EXAM: CT ORBITS WITHOUT CONTRAST TECHNIQUE: Multidetector CT imaging of the orbits was performed using the standard protocol without intravenous contrast. Multiplanar CT image reconstructions were also generated. RADIATION DOSE REDUCTION: This exam was performed according to the departmental dose-optimization program which includes automated  exposure control, adjustment of the mA and/or kV according to patient size and/or use of iterative reconstruction technique. COMPARISON:  None Available. FINDINGS: Orbits: No orbital mass or evidence of inflammation. Strabismus. Otherwise, normal appearance of the globes, optic nerve-sheath complexes, extraocular muscles, orbital fat and lacrimal glands. Visible paranasal sinuses:  Clear. Soft tissues: Normal. Osseous: No fracture or aggressive lesion. Limited intracranial: Evaluated on concurrent CT head. IMPRESSION: Strabismus. Otherwise, unremarkable CT of the orbits. MRI of the orbits with contrast could provide more sensitive evaluation if clinically warranted Electronically Signed   By: Feliberto Harts M.D.   On: 08/08/2021 13:02   MR ORBITS W WO CONTRAST  Result Date: 08/08/2021 CLINICAL DATA:  Neuro deficit, acute, stroke suspected EXAM: MRI HEAD AND ORBITS WITHOUT CONTRAST TECHNIQUE: Multiplanar, multi-echo pulse sequences of the brain and surrounding structures were acquired without intravenous contrast. Multiplanar, multi-echo pulse sequences of the orbits and surrounding structures were acquired including fat saturation techniques, without intravenous contrast administration. COMPARISON:  CT orbits from the same day. FINDINGS: MRI HEAD FINDINGS Brain: No acute infarction, hemorrhage, hydrocephalus, extra-axial collection or mass lesion. No pathologic enhancement. Vascular: Major arterial flow voids are maintained skull base. Skull and upper cervical spine: Normal marrow signal. Other: No mastoid effusions. MRI ORBITS FINDINGS Orbits: Strabismus. Question slight protrusion of the optic papilla bilaterally and mild prominence of the optic nerve sheaths. Otherwise, normal appearance of the globes, optic nerve-sheath complexes, extraocular muscles, orbital fat and lacrimal glands. No orbital mass or evidence of inflammation. No abnormal enhancement. Visualized sinuses: Clear. Soft tissues: Normal.  IMPRESSION: MRI head: No evidence of acute intracranial abnormality. MRI of the orbits: 1. Question slight protrusion of the optic papilla bilaterally and mild prominence of the optic nerve sheaths, which is nonspecific but could potentially represent papilledema. Consider correlation with fundoscopic exam. 2. Strabismus. Electronically Signed   By: Feliberto Harts M.D.   On: 08/08/2021 18:19     PERTINENT LAB RESULTS: CBC: Recent Labs    08/08/21 1129 08/09/21 0113  WBC 12.7* 12.6*  HGB 12.1 11.9*  HCT 36.8 36.5  PLT 366 358   CMET CMP     Component Value Date/Time   NA 134 (L) 08/10/2021 0331   K 3.7 08/10/2021 0331   CL 110 08/10/2021 0331   CO2 20 (L) 08/10/2021 0331   GLUCOSE 87 08/10/2021 0331   BUN 11 08/10/2021 0331   CREATININE 0.79 08/10/2021 0331   CALCIUM 8.8 (L) 08/10/2021 0331   PROT 7.1 08/09/2021 0113   ALBUMIN 3.5 08/09/2021 0113   AST 25 08/09/2021 0113   ALT 18 08/09/2021 0113   ALKPHOS 53 08/09/2021 0113   BILITOT 0.3 08/09/2021 0113   GFRNONAA >60 08/10/2021 0331    GFR Estimated Creatinine Clearance: 157.6 mL/min (by C-G formula based on SCr of 0.79 mg/dL). No results for input(s): LIPASE, AMYLASE in the last 72 hours. No results for input(s): CKTOTAL, CKMB, CKMBINDEX, TROPONINI in the last 72 hours. Invalid input(s): POCBNP No results for input(s): DDIMER in the last 72 hours. No results for input(s): HGBA1C in the last 72 hours. No results for input(s): CHOL, HDL, LDLCALC, TRIG, CHOLHDL, LDLDIRECT in the last 72 hours. No results for input(s): TSH, T4TOTAL, T3FREE, THYROIDAB in the last 72 hours.  Invalid input(s): FREET3 No results for input(s): VITAMINB12, FOLATE, FERRITIN, TIBC, IRON, RETICCTPCT in the last 72 hours. Coags: Recent Labs    08/09/21 0113  INR 1.0   Microbiology: Recent Results (from the past 240 hour(s))  CSF culture w Gram Stain     Status: None (Preliminary result)   Collection Time: 08/09/21 12:55 PM   Specimen:  PATH Cytology CSF; Cerebrospinal Fluid  Result Value Ref Range Status   Specimen Description CSF  Final   Special Requests NONE  Final   Gram Stain  Final    WBC PRESENT, PREDOMINANTLY MONONUCLEAR NO ORGANISMS SEEN CYTOSPIN SMEAR    Culture   Final    NO GROWTH < 24 HOURS Performed at [facility], [address] [place], [place] [address]    Report Status PENDING  Incomplete    FURTHER DISCHARGE INSTRUCTIONS:  Get Medicines reviewed and adjusted: Please take all your medications with you for your next visit with your Primary MD  Laboratory/radiological data: Please request your Primary MD to go over all hospital tests and procedure/radiological results at the follow up, please ask your Primary MD to get all Hospital records sent to his/her office.  In some cases, they will be blood work, cultures and biopsy results pending at the time of your discharge. Please request that your primary care M.D. goes through all the records of your hospital data and follows up on these results.  Also Note the following: If you experience worsening of your admission symptoms, develop shortness of breath, life threatening emergency, suicidal or homicidal thoughts you must seek medical attention immediately by calling 911 or calling your MD immediately  if symptoms less severe.  You must read complete instructions/literature along with all the possible adverse reactions/side effects for all the Medicines you take and that have been prescribed to you. Take any new Medicines after you have completely understood and accpet all the possible adverse reactions/side effects.   Do not drive when taking Pain medications or sleeping medications (Benzodaizepines)  Do not take more than prescribed Pain, Sleep and Anxiety Medications. It is not advisable to combine anxiety,sleep and pain medications without talking with your primary care practitioner  Special Instructions: If you have smoked or chewed  Tobacco  in the last 2 yrs please stop smoking, stop any regular Alcohol  and or any Recreational drug use.  Wear Seat belts while driving.  Please note: You were cared for by a hospitalist during your hospital stay. Once you are discharged, your primary care physician will handle any further medical issues. Please note that NO REFILLS for any discharge medications will be authorized once you are discharged, as it is imperative that you return to your primary care physician (or establish a relationship with a primary care physician if you do not have one) for your post hospital discharge needs so that they can reassess your need for medications and monitor your lab values.  Total Time spent coordinating discharge including counseling, education and face to face time equals greater than 30 minutes.  SignedJeoffrey Massed: Mykira Hofmeister 08/10/2021 12:03 PM

## 2021-08-12 LAB — CSF CULTURE W GRAM STAIN: Culture: NO GROWTH

## 2021-08-15 ENCOUNTER — Encounter: Payer: Self-pay | Admitting: Radiology

## 2021-08-15 DIAGNOSIS — Z0289 Encounter for other administrative examinations: Secondary | ICD-10-CM

## 2021-08-28 ENCOUNTER — Telehealth: Payer: Self-pay | Admitting: *Deleted

## 2021-08-28 NOTE — Telephone Encounter (Signed)
Received call from [facility] RN with [employer], the patient's insurance plan. She stated that she followed up with patient post hospitalization. The patient has hospital FU June 27th with Dr Delena Bali but may run out of diamox before then. Patient does [facility]. I advised [facility] call [facility] to get that [facility], will send to Dr Delena Bali. Patient will have enough to last her till FU when [facility] gives her the last  hospital refill. Victorino Dike verbalized understanding, appreciation.

## 2021-08-31 ENCOUNTER — Ambulatory Visit: Payer: Self-pay | Admitting: *Deleted

## 2021-08-31 NOTE — Telephone Encounter (Signed)
  Chief Complaint: Needs new GYN dr. For treatment of PCOS.   GYN she was seeing with [facility] will not see her until she pays off her bill. Symptoms: Having a period for several months now. Frequency: continuously  Pertinent Negatives: Patient denies having a PCP.   Tried to get her established with Primary Care at [facility] which is where she called into but she did not want to do do that now.   Disposition: [x] ED /[] Urgent Care (no appt availability in office) / [] Appointment(In office/virtual)/ []  [facility]/ [] Home Care/ [] Refused Recommended Disposition /[] [facility]/ []  Follow-up with PCP Additional Notes: I let her know to go to the ED if she got worse or the bleeding became heavy and she still wasn't established with a gyn dr.   She thanked me for my help.

## 2021-08-31 NOTE — Telephone Encounter (Signed)
Message from [place] sent at 08/31/2021  1:01 PM EDT  Summary: intracranial hypertension/PCOS/extended period   Pt stated she has intracranial hypertension as well as PCOS and is unsure if she needs primary care or an OBGYN. pt stated she is currently dealing with an extended period that is getting worse and is very heavy and uncomfortable. Pt stated has had a period for months    Denied abdominal pain.   Pt seeking clinical advice.           Call History   Type Contact Phone/Fax User  08/31/2021 12:57 PM EDT Phone (Incoming) Stephanie Cohen, Stephanie Cohen (Self) [phone] Stephanie Cohen) Cohen, Stephanie   Reason for Disposition  Periods last > 7 days    Has PCOS  Looking for a new gyn dr.  Answer Assessment - Initial Assessment Questions 1. AMOUNT: "Describe the bleeding that you are having."    - SPOTTING: spotting, or pinkish / brownish mucous discharge; does not fill panty liner or pad    - MILD:  less than 1 pad / hour; less than patient's usual menstrual bleeding   - MODERATE: 1-2 pads / hour; 1 menstrual cup every 6 hours; small-medium blood clots (e.g., pea, grape, small coin)   - SEVERE: soaking 2 or more pads/hour for 2 or more hours; 1 menstrual cup every 2 hours; bleeding not contained by pads or continuous red blood from vagina; large blood clots (e.g., golf ball, large coin)      I've been having a period for months.  I don't have an OB-GYN dr. 2. ONSET: "When did the bleeding begin?" "Is it continuing now?"     Months ago I also have intracranial hypertension and PCOS.   I don't have a PCP or any drs.    I now have insurance so want to be seen.  The reason she called was into Primary Care at [facility] for a Dr. Emelda Fear who is a gyn dr. Per her insurance [employer].   I let her know there wasn't a Dr. Emelda Fear at [facility] but I would be glad to set her up to establish with a PCP at [facility] if whe wanted, since she does not have a PCP.  She responded,  "Not right now".   She used  to go to [facility] but she has to finish paying off the bill before they will see her again.   She has decided to further pursue a gyn dr for the PCOS and menstrual bleeding.   I let her know if she had other questions to call us back and we would be happy to help her.    I mentioned she could go to the ED if the bleeding becomes very heavy and she became concerned or weak.   She didn't want to go to the ED.  She thanked me for my assistance.  3. MENSTRUAL PERIOD: "When was the last normal menstrual period?" "How is this different than your period?"     Bleeding now 4. REGULARITY: "How regular are your periods?"     It is constant.  It will be light at times and then other times I'm flowing very heavy.    5. ABDOMINAL PAIN: "Do you have any pain?" "How bad is the pain?"  (e.g., Scale 1-10; mild, moderate, or severe)   - MILD (1-3): doesn't interfere with normal activities, abdomen soft and not tender to touch    - MODERATE (4-7): interferes with normal activities or awakens from sleep, abdomen  tender to touch    - SEVERE (8-10): excruciating pain, doubled over, unable to do any normal activities      Very little abd pain or cramping when period is heavy.   6. PREGNANCY: "Could you be pregnant?" "Are you sexually active?" "Did you recently give birth?"     Not asked 7. BREASTFEEDING: "Are you breastfeeding?"     Not asked 8. HORMONES: "Are you taking any hormone medications, prescription or OTC?" (e.g., birth control pills, estrogen)     Not asked 9. BLOOD THINNERS: "Do you take any blood thinners?" (e.g., Coumadin/warfarin, Pradaxa/dabigatran, aspirin)     Not asked 10. CAUSE: "What do you think is causing the bleeding?" (e.g., recent gyn surgery, recent gyn procedure; known bleeding disorder, cervical cancer, polycystic ovarian disease, fibroids)         PCOS needs new gyn now that she has insurance.  The dr. Her insurance recommended is not at Primary Care at [facility]. 11. HEMODYNAMIC  STATUS: "Are you weak or feeling lightheaded?" If Yes, ask: "Can you stand and walk normally?"        Not asked 12. OTHER SYMPTOMS: "What other symptoms are you having with the bleeding?" (e.g., passed tissue, vaginal discharge, fever, menstrual-type cramps)       Light and heavy for several months now which is why I need to establish with a new GYN.  Protocols used: Vaginal Bleeding - Abnormal-A-AH

## 2021-09-12 ENCOUNTER — Ambulatory Visit: Payer: 59 | Admitting: Psychiatry

## 2021-09-14 ENCOUNTER — Ambulatory Visit (INDEPENDENT_AMBULATORY_CARE_PROVIDER_SITE_OTHER): Payer: 59 | Admitting: Neurology

## 2021-09-14 ENCOUNTER — Encounter: Payer: Self-pay | Admitting: Neurology

## 2021-09-14 VITALS — BP 138/74 | HR 86 | Ht 65.5 in | Wt 308.2 lb

## 2021-09-14 DIAGNOSIS — G932 Benign intracranial hypertension: Secondary | ICD-10-CM | POA: Diagnosis not present

## 2021-09-14 MED ORDER — ACETAZOLAMIDE 250 MG PO TABS: ORAL_TABLET | ORAL | 3 refills | Status: DC

## 2021-09-14 NOTE — Progress Notes (Signed)
[facility]    Provider:  Dr Lucia Gaskins Requesting Provider: Maretta Bees, MD Primary Care Provider:  Patient, No Pcp Per  CC:  IDIOPATHIC INTRACRANIAL HYPERTENSION   HPI:  Stephanie Cohen is a 21 y.o. female here as requested by Maretta Bees, MD for IDIOPATHIC INTRACRANIAL HYPERTENSION. Sent to the emergency room from Dr. Dione Booze (Ophthalmology). She was on diamox and ran out. 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   She is feeling better. She has reduced appetite. She started having episodes of vision loss, wooshing in the ears, pressure headache. She has improved, no complete vision loss. She is feeling better now, headaches improved, swishing reduced, no more vison changes, we discussed IDIOPATHIC INTRACRANIAL HYPERTENSION and treatment, risk of vision loss, they were very educated on the syndrome, discussed weight loss. Mother is her an provides much information. No other focal neurologic deficits, associated symptoms, inciting events or modifiable factors.   Reviewed notes, labs and imaging from outside physicians, which showed:  MRI 08/08/2021: IMPRESSION: MRI head:   No evidence of acute intracranial abnormality.   MRI of the orbits:   1. Question slight protrusion of the optic papilla bilaterally and mild prominence of the optic nerve sheaths, which is nonspecific but could potentially represent papilledema. Consider correlation with fundoscopic exam. 2. Strabismus.  CTV: IMPRESSION: No acute intracranial abnormality.   No dural venous sinus thrombosis.  Review of Systems: Patient complains of symptoms per HPI as well as the following symptoms headache impoved. Pertinent negatives and positives per HPI. All others  negative.   Social History   Socioeconomic History   Marital status: Single    Spouse name: Not on file   Number of children: Not on file   Years of education: Not on file   Highest education level: Not on file  Occupational History   Not on file  Tobacco Use   Smoking status: Never   Smokeless tobacco: Never  Vaping Use   Vaping Use: Never used  Substance and Sexual Activity   Alcohol use: Not Currently    Comment: occasional/ social   Drug use: Never   Sexual activity: Yes  Other Topics Concern   Not on file  Social History Narrative   Caffiene occasional Rare.     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 Strain: Not on [employer] Insecurity: Not on file  Transportation Needs: Not on file  Physical Activity: Not on file  Stress: Not on file  Social Connections: Not on file  Intimate Partner Violence: Not on file    Family History  Problem Relation Age of Onset   Asthma Father    Diabetes Father    Crohn's disease Maternal Grandmother    Stroke Neg Hx     Past Medical History:  Diagnosis Date   Asthma    IIH (idiopathic intracranial hypertension)    PCOS (polycystic ovarian syndrome)     Patient Active Problem List   Diagnosis Date Noted   Pseudotumor cerebri 08/09/2021   Idiopathic intracranial hypertension 08/08/2021   Mild intermittent asthma without complication 08/08/2021   Class 3 severe obesity due to excess calories without serious comorbidity with body mass index (BMI) of 50.0 to 59.9 in adult [facility]) 08/08/2021  Leukocytosis 08/08/2021   History of ADHD 12/05/2020   Referred for medication therapy management 12/05/2020    Past Surgical History:  Procedure Laterality Date   NO PAST SURGERIES      Current Outpatient Medications  Medication Sig Dispense Refill   cetirizine (ZYRTEC) 10 MG tablet Take 10 mg by mouth 2 (two) times daily.     acetaZOLAMIDE (DIAMOX) 250 MG tablet Start with 250mg  twice  daily. In 1-2 weeks increase to 250mg  in the morning and 500mg  at bedtime. In 1-2 weeks increase to 500mg  twice daily. 360 tablet 3   No current facility-administered medications for this visit.    Allergies as of 09/14/2021   (No Known Allergies)    Vitals: BP 138/74   Pulse 86   Ht 5' 5.5" (1.664 m)   Wt (!) 308 lb 3.2 oz (139.8 kg)   BMI 50.51 kg/m  Last Weight:  Wt Readings from Last 1 Encounters:  09/14/21 (!) 308 lb 3.2 oz (139.8 kg)   Last Height:   Ht Readings from Last 1 Encounters:  09/14/21 5' 5.5" (1.664 m)     Physical exam: Exam: Gen: NAD, conversant, well nourised, obese, well groomed                     CV: RRR, no MRG. No Carotid Bruits. No peripheral edema, warm, nontender Eyes: Conjunctivae clear without exudates or hemorrhage  Neuro: Detailed Neurologic Exam  Speech:    Speech is normal; fluent and spontaneous with normal comprehension.  Cognition:    The patient is oriented to person, place, and time;     recent and remote memory intact;     language fluent;     normal attention, concentration,     fund of knowledge Cranial Nerves:    The pupils are equal, round, and reactive to light. Papilledema +2. Visual fields are full to finger confrontation. Extraocular movements are intact. Trigeminal sensation is intact and the muscles of mastication are normal. The face is symmetric. The palate elevates in the midline. Hearing intact. Voice is normal. Shoulder shrug is normal. The tongue has normal motion without fasciculations.   Coordination:    Normal finger to nose and heel to shin. Normal rapid alternating movements.   Gait:    Heel-toe and tandem gait are normal.   Motor Observation:    No asymmetry, no atrophy, and no involuntary movements noted. Tone:    Normal muscle tone.    Posture:    Posture is normal. normal erect    Strength:    Strength is V/V in the upper and lower limbs.      Sensation: intact to LT     Reflex  Exam:  DTR's:    Deep tendon reflexes in the upper and lower extremities are normal bilaterally.   Toes:    The toes are downgoing bilaterally.   Clonus:    Clonus is absent.    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  Symptoms and papilledema improved.   Patient has been having problems with physicians refilling meds. She was started on 500mg  bid, we will titrate up to that again, may need to further increase will see back in 8 weeks.  [facility]  She denies any  symptoms of sleep apnea  Weight loss is critical, discussed weight loss and risk of permanent vision loss    For any acute change especially worsening headache or vision loss call 911 and proceed to ED  To prevent or relieve headaches, try the following: Cool Compress. Lie down and place a cool compress on your head.  Avoid headache triggers. If certain foods or odors seem to have triggered your migraines in the past, avoid them. A headache diary might help you identify triggers.  Include physical activity in your daily routine. Try a daily walk or other moderate aerobic exercise.  Manage stress. Find healthy ways to cope with the stressors, such as delegating tasks on your to-do list.  Practice relaxation techniques. Try deep breathing, yoga, massage and visualization.  Eat regularly. Eating regularly scheduled meals and maintaining a healthy diet might help prevent headaches. Also, drink plenty of fluids.  Follow a regular sleep schedule. Sleep deprivation might contribute to headaches Consider biofeedback. With this mind-body technique, you learn to control certain bodily functions -- such as muscle tension, heart rate and blood pressure -- to prevent  headaches or reduce headache pain.    Proceed to emergency room if you experience new or worsening symptoms or symptoms do not resolve, if you have new neurologic symptoms or if headache is severe, or for any concerning symptom.   Provided education and documentation from American headache Society toolbox including articles on: pseudotumoer cerebri(IIH), chronic migraine medication overuse headache, chronic migraines, prevention of migraines and other headaches, behavioral and other nonpharmacologic treatments for headache.   Meds ordered this encounter  Medications   DISCONTD: acetaZOLAMIDE (DIAMOX) 250 MG tablet    Sig: Start with 500mg  twice daily. In 1-2 weeks increase to 500mg  in the morning and 1000mg  at bedtime. In 1-2 weeks increase to 1000mg  twice daily.    Dispense:  360 tablet    Refill:  3   acetaZOLAMIDE (DIAMOX) 250 MG tablet    Sig: Start with 250mg  twice daily. In 1-2 weeks increase to 250mg  in the morning and 500mg  at bedtime. In 1-2 weeks increase to 500mg  twice daily.    Dispense:  360 tablet    Refill:  3    Please discontinue any prior prescriptions. This is the right prescription for patient.      Meds ordered this encounter  Medications   DISCONTD: acetaZOLAMIDE (DIAMOX) 250 MG tablet    Sig: Start with 500mg  twice daily. In 1-2 weeks increase to 500mg  in the morning and 1000mg  at bedtime. In 1-2 weeks increase to 1000mg  twice daily.    Dispense:  360 tablet    Refill:  3   acetaZOLAMIDE (DIAMOX) 250 MG tablet    Sig: Start with 250mg  twice daily. In 1-2 weeks increase to 250mg  in the morning and 500mg  at bedtime. In 1-2 weeks increase to 500mg  twice daily.    Dispense:  360 tablet    Refill:  3    Please discontinue any prior prescriptions. This is the right prescription for patient.    Cc: , MD,  Patient, No Pcp Per  , MD  [facility] [address] [place],   Phone  [phone] Fax [phone]   I spent over 45 minutes of face-to-face and non-face-to-face time with patient on the  1. IIH (idiopathic intracranial hypertension)    diagnosis.  This included previsit chart review, lab review, study review, order entry, electronic health record documentation, patient education on  the different diagnostic and therapeutic options, counseling and coordination of care, risks and benefits of management, compliance, or risk factor reduction

## 2021-09-14 NOTE — Patient Instructions (Addendum)
Follow up in 8 weeks [facility] [phone] Idiopathic Intracranial Hypertension  Idiopathic intracranial hypertension (IIH) is a condition that increases pressure around the brain. The fluid that surrounds the brain and spinal cord (cerebrospinal fluid, or CSF) increases and causes the pressure. Idiopathic means that the cause of this condition is not known. IIH affects the brain and spinal cord (neurological disorder). If this condition is not treated, it can cause vision loss or blindness. What are the causes? The cause of this condition is not known. What increases the risk? The following factors may make you more likely to develop this condition: Being very overweight (obese). Being a female between the ages of 16 and 45 years old, who has not gone through menopause. Taking certain medicines, such as birth control or steroids. What are the signs or symptoms? Symptoms of this condition include: Headaches. This is the most common symptom. Brief episodes of total blindness. Double vision, blurred vision, or poor side (peripheral) vision. Pain in the shoulders or neck. Nausea and vomiting. A sound like rushing water or a pulsing sound within the ears (pulsatile tinnitus), or ringing in the ears. How is this diagnosed? This condition may be diagnosed based on: Your symptoms and medical history. Imaging tests of the brain, such as: CT scan. MRI. Magnetic resonance venogram (MRV) to check the veins. Diagnostic lumbar puncture. This is a procedure to remove and examine a sample of cerebrospinal fluid. This procedure can determine whether too much fluid may be causing IIH. A thorough eye exam to check for swelling or nerve damage in the eyes. How is this treated? Treatment for this condition depends on the symptoms. The goal of treatment is to decrease the pressure around your brain. Common treatments include: Weight loss through healthy eating, salt restriction,  and exercise, if you are overweight. Medicines to decrease the production of spinal fluid and lower the pressure within your skull. Medicines to prevent or treat headaches. Other treatments may include: Surgery to place drains (shunts) in your brain for removing excess fluid. Lumbar puncture to remove excess cerebrospinal fluid. Follow these instructions at home: If you are overweight or obese, work with your health care provider to lose weight. Take over-the-counter and prescription medicines only as told by your health care provider. Ask your health care provider if the medicine prescribed to you requires you to avoid driving or using machinery. Do not use any products that contain nicotine or tobacco, such as cigarettes, e-cigarettes, and chewing tobacco. If you need help quitting, ask your health care provider. Keep all follow-up visits as told by your health care provider. This is important. Contact a health care provider if: You have changes in your vision, such as: Double vision. Blurred vision. Poor peripheral vision. Get help right away if: You have any of the following symptoms and they get worse or do not get better: Headaches. Nausea. Vomiting. Sudden trouble seeing. Summary Idiopathic intracranial hypertension (IIH) is a condition that increases pressure around the brain. The cause is not known (is idiopathic). The most common symptom of IIH is headaches. Vision changes, pain in the shoulders or neck, nausea, and vomiting may also occur. Treatment for this condition depends on your symptoms. The goal of treatment is to decrease the pressure around your brain. If you are overweight or obese, work with your health care provider to lose weight. Take over-the-counter and prescription medicines only as told by your health care provider. This information is not intended to replace advice given  to you by your health care provider. Make sure you discuss any questions you have with  your health care provider. Document Revised: 02/14/2019 Document Reviewed: 02/14/2019 Elsevier Patient Education  2023 [employer].

## 2021-09-20 ENCOUNTER — Encounter: Payer: Self-pay | Admitting: Neurology

## 2021-09-22 ENCOUNTER — Ambulatory Visit: Payer: 59 | Admitting: Psychiatry

## 2021-10-03 ENCOUNTER — Ambulatory Visit (INDEPENDENT_AMBULATORY_CARE_PROVIDER_SITE_OTHER): Payer: 59 | Admitting: Family Medicine

## 2021-10-03 ENCOUNTER — Encounter (INDEPENDENT_AMBULATORY_CARE_PROVIDER_SITE_OTHER): Payer: Self-pay | Admitting: Family Medicine

## 2021-10-03 VITALS — BP 112/69 | HR 69 | Temp 98.1°F | Ht 65.0 in | Wt 304.0 lb

## 2021-10-03 DIAGNOSIS — D509 Iron deficiency anemia, unspecified: Secondary | ICD-10-CM

## 2021-10-03 DIAGNOSIS — R0602 Shortness of breath: Secondary | ICD-10-CM | POA: Diagnosis not present

## 2021-10-03 DIAGNOSIS — R5383 Other fatigue: Secondary | ICD-10-CM | POA: Diagnosis not present

## 2021-10-03 DIAGNOSIS — Z1331 Encounter for screening for depression: Secondary | ICD-10-CM

## 2021-10-03 DIAGNOSIS — E282 Polycystic ovarian syndrome: Secondary | ICD-10-CM

## 2021-10-03 DIAGNOSIS — Z6841 Body Mass Index (BMI) 40.0 and over, adult: Secondary | ICD-10-CM

## 2021-10-04 LAB — VITAMIN D 25 HYDROXY (VIT D DEFICIENCY, FRACTURES): Vit D, 25-Hydroxy: 13.2 ng/mL — ABNORMAL LOW (ref 30.0–100.0)

## 2021-10-04 LAB — FOLATE: Folate: 13.3 ng/mL (ref >3.0)

## 2021-10-04 LAB — HEMOGLOBIN A1C
Est. average glucose Bld gHb Est-mCnc: 108 mg/dL
Hgb A1c MFr Bld: 5.4 % (ref 4.8–5.6)

## 2021-10-04 LAB — LIPID PANEL WITH LDL/HDL RATIO
Cholesterol, Total: 135 mg/dL (ref 100–199)
HDL: 43 mg/dL (ref >39)
LDL Chol Calc (NIH): 78 mg/dL (ref 0–99)
LDL/HDL Ratio: 1.8 ratio (ref 0.0–3.2)
Triglycerides: 68 mg/dL (ref 0–149)
VLDL Cholesterol Cal: 14 mg/dL (ref 5–40)

## 2021-10-04 LAB — T4, FREE: Free T4: 1.21 ng/dL (ref 0.82–1.77)

## 2021-10-04 LAB — ANEMIA PANEL
Ferritin: 13 ng/mL — ABNORMAL LOW (ref 15–150)
Folate, Hemolysate: 466 ng/mL
Folate, RBC: 1223 ng/mL (ref >498)
Hematocrit: 38.1 % (ref 34.0–46.6)
Iron Saturation: 11 % — ABNORMAL LOW (ref 15–55)
Iron: 38 ug/dL (ref 27–159)
Retic Ct Pct: 1.5 % (ref 0.6–2.6)
Total Iron Binding Capacity: 349 ug/dL (ref 250–450)
UIBC: 311 ug/dL (ref 131–425)
Vitamin B-12: 343 pg/mL (ref 232–1245)

## 2021-10-04 LAB — T3: T3, Total: 137 ng/dL (ref 71–180)

## 2021-10-04 LAB — INSULIN, RANDOM: INSULIN: 17.7 u[IU]/mL (ref 2.6–24.9)

## 2021-10-04 LAB — TSH: TSH: 3.12 u[IU]/mL (ref 0.450–4.500)

## 2021-10-10 NOTE — Progress Notes (Signed)
Chief Complaint:   Leavenworth (MR# [id number]) is a 21 y.o. female who presents for evaluation and treatment of obesity and related comorbidities. Current BMI is Body mass index is 50.59 kg/m. Leonna has been struggling with her weight for many years and has been unsuccessful in either losing weight, maintaining weight loss, or reaching her healthy weight goal.  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  Takeout 5 or more times per week.  Skips breakfast and lunch frequently.  Wakes up at 12 PM.  Breakfast, cereal, 2 cups or 3 cups with true mood (satisfied).  Dinner, [employer] or [employer] (depending on meal).  Snack, cereal again or Oreo cookies (12 cookies).  Ranessa is currently in the action stage of change and ready to dedicate time achieving and maintaining a healthier weight. Monick is interested in becoming our patient and working on intensive lifestyle modifications including (but not limited to) diet and exercise for weight loss.  Natara's habits were reviewed today and are as follows: Her family eats meals together, she started gaining weight in college, her heaviest weight ever was 308 pounds, she is a picky eater and doesn't like to eat healthier foods, she has significant food cravings issues, she snacks frequently in the evenings, she wakes up frequently in the middle of the night to eat, she skips meals frequently, she is frequently drinking liquids with calories, she frequently makes poor food choices, she has problems with excessive hunger, she frequently eats larger portions than normal, and she struggles with emotional eating.  Depression Screen Eliza's Food and Mood (modified PHQ-9) score was 17.     10/03/2021    9:10 AM  Depression screen PHQ 2/9  Decreased Interest 1  Down, Depressed, Hopeless 2  PHQ - 2 Score 3  Altered sleeping 3  Tired, decreased energy 3   Change in appetite 3  Feeling bad or failure about yourself  1  Trouble concentrating 3  Moving slowly or fidgety/restless 1  Suicidal thoughts 0  PHQ-9 Score 17  Difficult doing work/chores Somewhat difficult   Subjective:   1. Other fatigue Daionna admits to daytime somnolence and admits to waking up still tired. Patient has a history of symptoms of daytime fatigue and morning fatigue. Alzora generally gets 7 hours of sleep per night, and states that she has nightime awakenings. Snoring is present. Apneic episodes are not present. Epworth Sleepiness Score is 16.  EKG on 08/08/2021, normal sinus rhythm.  2. SOBOE (shortness of breath on exertion) Aalaysia notes increasing shortness of breath with exercising and seems to be worsening over time with weight gain. She notes getting out of breath sooner with activity than she used to. This has not gotten worse recently. Lucillie denies shortness of breath at rest or orthopnea.  3. PCOS (polycystic ovarian syndrome) Albie was previously on OCP, and she stopped due to lack of insurance.  She notes carbohydrate cravings.  4. Microcytic anemia Lyrick's last hemoglobin was low, MCV 62, RDW 17.0.  Labs were done on 08/09/2021.  She is not on iron or prenatal vitamins.  Assessment/Plan:   1. Other fatigue Keyari does feel that her weight is causing her energy to be lower than it should be. Fatigue may be related to obesity, depression or many other causes. Labs will be ordered, and in the meanwhile, [place] will focus on self care including making  healthy food choices, increasing physical activity and focusing on stress reduction.  - Lipid Panel With LDL/HDL Ratio - VITAMIN D 25 Hydroxy (Vit-D Deficiency, Fractures) - TSH - T4, free - T3  2. SOBOE (shortness of breath on exertion) Eyleen does feel that she gets out of breath more easily that she used to when she exercises. Riham's shortness of breath appears to be obesity related and  exercise induced. She has agreed to work on weight loss and gradually increase exercise to treat her exercise induced shortness of breath. Will continue to monitor closely.  3. PCOS (polycystic ovarian syndrome) We will check labs today.  Chrishawna was encouraged to make an appointment with her GYN.  - Hemoglobin A1c - Insulin, random  4. Microcytic anemia We will check labs today, and we will follow-up at Hallie's next appointment.  - Vitamin B12 - Folate - Anemia panel  5. Depression screening Shandell had a positive depression screening. Depression is commonly associated with obesity and often results in emotional eating behaviors. We will monitor this closely and work on CBT to help improve the non-hunger eating patterns. Referral to Psychology may be required if no improvement is seen as she continues in our clinic.  6. Class 3 severe obesity with serious comorbidity and body mass index (BMI) of 50.0 to 59.9 in adult, unspecified obesity type (HCC) Lendy is currently in the action stage of change and her goal is to continue with weight loss efforts. I recommend Emojean begin the structured treatment plan as follows:  She has agreed to the Category 3 Plan.  Exercise goals: No exercise has been prescribed at this time.   Behavioral modification strategies: increasing lean protein intake, no skipping meals, meal planning and cooking strategies, keeping healthy foods in the home, and dealing with family or coworker sabotage.  She was informed of the importance of frequent follow-up visits to maximize her success with intensive lifestyle modifications for her multiple health conditions. She was informed we would discuss her lab results at her next visit unless there is a critical issue that needs to be addressed sooner. Vauda agreed to keep her next visit at the agreed upon time to discuss these results.  Objective:   Blood pressure 112/69, pulse 69, temperature 98.1 F (36.7 C),  height 5\' 5"  (1.651 m), weight (!) 304 lb (137.9 kg), SpO2 99 %. Body mass index is 50.59 kg/m.  EKG: Normal sinus rhythm, rate 87 BPM.  Indirect Calorimeter completed today shows a VO2 of 267 and a REE of 1843.  Her calculated basal metabolic rate is thus her basal metabolic rate is worse than expected.  General: Cooperative, alert, well developed, in no acute distress. HEENT: Conjunctivae and lids unremarkable. Cardiovascular: Regular rhythm.  Lungs: Normal work of breathing. Neurologic: No focal deficits.   Lab Results  Component Value Date   CREATININE 0.79 08/10/2021   BUN 11 08/10/2021   NA 134 (L) 08/10/2021   K 3.7 08/10/2021   CL 110 08/10/2021   CO2 20 (L) 08/10/2021   Lab Results  Component Value Date   ALT 18 08/09/2021   AST 25 08/09/2021   ALKPHOS 53 08/09/2021   BILITOT 0.3 08/09/2021   Lab Results  Component Value Date   HGBA1C 5.4 10/03/2021   Lab Results  Component Value Date   INSULIN 17.7 10/03/2021   Lab Results  Component Value Date   TSH 3.120 10/03/2021   Lab Results  Component Value Date   CHOL 135 10/03/2021  HDL 43 10/03/2021   LDLCALC 78 10/03/2021   TRIG 68 10/03/2021   Lab Results  Component Value Date   WBC 12.6 (H) 08/09/2021   HGB 11.9 (L) 08/09/2021   HCT 38.1 10/03/2021   MCV 62.0 (L) 08/09/2021   PLT 358 08/09/2021   Lab Results  Component Value Date   IRON 38 10/03/2021   TIBC 349 10/03/2021   FERRITIN 13 (L) 10/03/2021   Attestation Statements:   Reviewed by clinician on day of visit: allergies, medications, problem list, medical history, surgical history, family history, social history, and previous encounter notes.  Time spent on visit including pre-visit chart review and post-visit charting and care was 45 minutes.   I, Burt Knack, am acting as transcriptionist for Reuben Likes, MD.  This is the patient's [facility] visit at [facility]. The patient's NEW PATIENT PACKET was reviewed  at length. Included in the packet: current and past health history, medications, allergies, ROS, gynecologic history (women only), surgical history, family history, social history, weight history, weight loss surgery history (for those that have had weight loss surgery), nutritional evaluation, mood and food questionnaire, PHQ9, Epworth questionnaire, sleep habits questionnaire, patient life and health improvement goals questionnaire. These will all be scanned into the patient's chart under media.   During the visit, I independently reviewed the patient's EKG, bioimpedance scale results, and indirect calorimeter results. I used this information to tailor a meal plan for the patient that will help her to lose weight and will improve her obesity-related conditions going forward. I performed a medically necessary appropriate examination and/or evaluation. I discussed the assessment and treatment plan with the patient. The patient was provided an opportunity to ask questions and all were answered. The patient agreed with the plan and demonstrated an understanding of the instructions. Labs were ordered at this visit and will be reviewed at the next visit unless more critical results need to be addressed immediately. Clinical information was updated and documented in the EMR.   Time spent on visit including pre-visit chart review and post-visit care was 40 minutes.    I have reviewed the above documentation for accuracy and completeness, and I agree with the above. - Reuben Likes, MD

## 2021-10-16 ENCOUNTER — Encounter: Payer: Self-pay | Admitting: Nurse Practitioner

## 2021-10-16 ENCOUNTER — Ambulatory Visit (INDEPENDENT_AMBULATORY_CARE_PROVIDER_SITE_OTHER): Payer: 59 | Admitting: Nurse Practitioner

## 2021-10-16 VITALS — BP 132/80 | HR 67 | Ht 65.25 in | Wt 302.0 lb

## 2021-10-16 DIAGNOSIS — N939 Abnormal uterine and vaginal bleeding, unspecified: Secondary | ICD-10-CM

## 2021-10-16 DIAGNOSIS — N898 Other specified noninflammatory disorders of vagina: Secondary | ICD-10-CM | POA: Diagnosis not present

## 2021-10-16 DIAGNOSIS — E282 Polycystic ovarian syndrome: Secondary | ICD-10-CM

## 2021-10-16 LAB — WET PREP FOR TRICH, YEAST, CLUE

## 2021-10-16 MED ORDER — MEGESTROL ACETATE 40 MG PO TABS: 40.0000 mg | ORAL_TABLET | Freq: Every day | ORAL | 0 refills | Status: DC

## 2021-10-16 MED ORDER — SLYND 4 MG PO TABS: 1.0000 | ORAL_TABLET | Freq: Every day | ORAL | 1 refills | Status: DC

## 2021-10-16 NOTE — Progress Notes (Signed)
   Acute Office Visit  Subjective:    Patient ID: Stephanie Cohen, female    DOB: May 28, 2000, 21 y.o.   MRN: [id number]   HPI 21 y.o. presents today for vaginal bleeding. Menses since January that varies from light to heavy, sometimes with clots. H/O PCOS. Believes she was on POPs in the past and had to take a double dose daily for bleeding control. Thinks it was Aygestin and/or Megace. Not sexually active. Reports vaginal odor. [facility]. 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    Review of Systems  Constitutional: Negative.   Genitourinary:  Positive for menstrual problem. Negative for vaginal discharge.       Vaginal odor       Objective:    Physical Exam Constitutional:      Appearance: Normal appearance. She is obese.  Genitourinary:    General: Normal vulva.     Vagina: Normal.     Cervix: Normal.     Uterus: Normal.      BP 132/80   Pulse 67   Ht 5' 5.25" (1.657 m)   Wt (!) 302 lb (137 kg)   SpO2 98%   BMI 49.87 kg/m  Wt Readings from Last 3 Encounters:  10/16/21 (!) 302 lb (137 kg)  10/03/21 (!) 304 lb (137.9 kg)  09/14/21 (!) 308 lb 3.2 oz (139.8 kg)        Patient informed chaperone available to be present for breast and/or pelvic exam. Patient has requested no chaperone to be present. Patient has been advised what will be completed during breast and pelvic exam.   Wet prep negative  Assessment & Plan:   Problem List Items Addressed This Visit   None Visit Diagnoses     PCOS (polycystic ovarian syndrome)    -  Primary   Relevant Medications   Drospirenone (SLYND) 4 MG TABS   Abnormal uterine bleeding       Relevant Medications   megestrol (MEGACE) 40 MG tablet   Drospirenone (SLYND) 4 MG TABS   Vaginal odor       Relevant Orders   WET PREP FOR TRICH, YEAST, CLUE       Plan: Discussed PCOS and management options with progestin-only contraception and/or Metformin.  We discussed how PCOS causes insulin insensitivity increasing risk for weight gain and diabetes. She wants to consider Metformin. Reviewed progestin-only contraceptive options. She is considering IUD but wants to try pills first. Will try Slynd since she failed other POPs. Megace provided if needed in the meantime until [facility] arrives. Aware Megace is not contraception. Boric acid vaginal suppositories twice weekly, daily probiotic for vaginal health. Negative wet prep today. All questions answered.     Olivia Mackie DNP, 11:44 AM 10/16/2021

## 2021-10-17 ENCOUNTER — Ambulatory Visit (INDEPENDENT_AMBULATORY_CARE_PROVIDER_SITE_OTHER): Payer: 59 | Admitting: Family Medicine

## 2021-10-17 ENCOUNTER — Telehealth: Payer: Self-pay

## 2021-10-17 ENCOUNTER — Encounter (INDEPENDENT_AMBULATORY_CARE_PROVIDER_SITE_OTHER): Payer: Self-pay | Admitting: Family Medicine

## 2021-10-17 VITALS — BP 105/69 | HR 81 | Temp 98.4°F | Ht 65.0 in | Wt 300.0 lb

## 2021-10-17 DIAGNOSIS — Z6841 Body Mass Index (BMI) 40.0 and over, adult: Secondary | ICD-10-CM

## 2021-10-17 DIAGNOSIS — E559 Vitamin D deficiency, unspecified: Secondary | ICD-10-CM

## 2021-10-17 DIAGNOSIS — E669 Obesity, unspecified: Secondary | ICD-10-CM

## 2021-10-17 DIAGNOSIS — R718 Other abnormality of red blood cells: Secondary | ICD-10-CM | POA: Diagnosis not present

## 2021-10-17 DIAGNOSIS — E8881 Metabolic syndrome: Secondary | ICD-10-CM

## 2021-10-17 MED ORDER — VITAMIN D (ERGOCALCIFEROL) 1.25 MG (50000 UNIT) PO CAPS: 50000.0000 [IU] | ORAL_CAPSULE | ORAL | 0 refills | Status: DC

## 2021-10-17 MED ORDER — METFORMIN HCL 500 MG PO TABS: 500.0000 mg | ORAL_TABLET | Freq: Every day | ORAL | 0 refills | Status: DC

## 2021-10-17 NOTE — Telephone Encounter (Signed)
Stephanie Cohen  [facility] Triage Pt called requesting to schedule an IUD insertion but not sure if she needs another ov or ok to schedule.  Please advise.

## 2021-10-18 ENCOUNTER — Telehealth: Payer: Self-pay | Admitting: Hematology and Oncology

## 2021-10-18 ENCOUNTER — Other Ambulatory Visit: Payer: Self-pay | Admitting: Neurology

## 2021-10-18 ENCOUNTER — Encounter: Payer: Self-pay | Admitting: Neurology

## 2021-10-18 ENCOUNTER — Other Ambulatory Visit: Payer: Self-pay | Admitting: *Deleted

## 2021-10-18 DIAGNOSIS — N939 Abnormal uterine and vaginal bleeding, unspecified: Secondary | ICD-10-CM

## 2021-10-18 NOTE — Telephone Encounter (Signed)
Message sent to appointments to schedule. 

## 2021-10-18 NOTE — Telephone Encounter (Signed)
Scheduled appt per 8/1 referral. Pt is aware of appt date and time. Pt is aware to arrive 15 mins prior to appt time and to bring and updated insurance card. Pt is aware of appt location.   

## 2021-10-18 NOTE — Telephone Encounter (Signed)
She can schedule appointment for IUD insertion. We discussed at her visit a couple of days ago. Thanks.

## 2021-10-19 ENCOUNTER — Other Ambulatory Visit: Payer: Self-pay | Admitting: Neurology

## 2021-10-19 MED ORDER — RIZATRIPTAN BENZOATE 10 MG PO TBDP: 10.0000 mg | ORAL_TABLET | ORAL | 11 refills | Status: DC | PRN

## 2021-10-19 NOTE — Progress Notes (Signed)
Chief Complaint:   OBESITY Stephanie Cohen is here to discuss her progress with her obesity treatment plan along with follow-up of her obesity related diagnoses. Stephanie Cohen is on the Category 3 Plan and states she is following her eating plan approximately 50% of the time. Stephanie Cohen states she is walking 3,000 steps 2 times per week.  Today's visit was #: 2 Starting weight: 304 lbs Starting date: 10/03/2021 Today's weight: 300 lbs Today's date: 10/17/2021 Total lbs lost to date: 4 lbs Total lbs lost since last in-office visit: 4  Interim History: Stephanie Cohen does not fell she was as successful as she wanted to be. The weekends tend to be less routine so she eats with her family and not on plan. She struggles to get all food in. Dinner is difficult because she eats with her mom. (Does eat sometimes but not necessarily on plan). Craving carbs and sugar.  Subjective:   1. Vitamin D deficiency Stephanie Cohen's last Vit D level of 13.2. She notes fatigue.  2. Insulin resistance Stephanie Cohen's A1c was 5.4, insulin was 17.7. She is considering Metformin--initially discussed with GYN NP.  3. Microcytosis Stephanie Cohen's hemoglobin low, MCV at 62, RDW slightly elevated. Iron within normal limits. Ferritin minimally low at 14, Iron Sat 11%.  Assessment/Plan:   1. Vitamin D deficiency We will refill Vit D 50,000 IU once weekly for 1 month with 0 refills.  -Refill Vitamin D, Ergocalciferol, (DRISDOL) 1.25 MG (50000 UNIT) CAPS capsule; Take 1 capsule (50,000 Units total) by mouth every 7 (seven) days.  Dispense: 4 capsule; Refill: 0  2. Insulin resistance START Metformin 500 mg by mouth daily for 1 month with 0 refills.  -Start metFORMIN (GLUCOPHAGE) 500 MG tablet; Take 1 tablet (500 mg total) by mouth daily with breakfast.  Dispense: 30 tablet; Refill: 0  3. Microcytosis Referred to hematology/oncology for further evaluation.  - Ambulatory referral to Hematology / Oncology  4. Obesity with current BMI of  50.0 Stephanie Cohen is currently in the action stage of change. As such, her goal is to continue with weight loss efforts. She has agreed to the Category 3 Plan.   Exercise goals: All adults should avoid inactivity. Some physical activity is better than none, and adults who participate in any amount of physical activity gain some health benefits.  Behavioral modification strategies: increasing lean protein intake, meal planning and cooking strategies, and keeping healthy foods in the home.  Stephanie Cohen has agreed to follow-up with our clinic in 3 weeks. She was informed of the importance of frequent follow-up visits to maximize her success with intensive lifestyle modifications for her multiple health conditions.   Objective:   Blood pressure 105/69, pulse 81, temperature 98.4 F (36.9 C), height 5\' 5"  (1.651 m), weight 300 lb (136.1 kg), SpO2 98 %. Body mass index is 49.92 kg/m.  General: Cooperative, alert, well developed, in no acute distress. HEENT: Conjunctivae and lids unremarkable. Cardiovascular: Regular rhythm.  Lungs: Normal work of breathing. Neurologic: No focal deficits.   Lab Results  Component Value Date   CREATININE 0.79 08/10/2021   BUN 11 08/10/2021   NA 134 (L) 08/10/2021   K 3.7 08/10/2021   CL 110 08/10/2021   CO2 20 (L) 08/10/2021   Lab Results  Component Value Date   ALT 18 08/09/2021   AST 25 08/09/2021   ALKPHOS 53 08/09/2021   BILITOT 0.3 08/09/2021   Lab Results  Component Value Date   HGBA1C 5.4 10/03/2021   Lab Results  Component Value Date  INSULIN 17.7 10/03/2021   Lab Results  Component Value Date   TSH 3.120 10/03/2021   Lab Results  Component Value Date   CHOL 135 10/03/2021   HDL 43 10/03/2021   LDLCALC 78 10/03/2021   TRIG 68 10/03/2021   Lab Results  Component Value Date   VD25OH 13.2 (L) 10/03/2021   Lab Results  Component Value Date   WBC 12.6 (H) 08/09/2021   HGB 11.9 (L) 08/09/2021   HCT 38.1 10/03/2021   MCV 62.0 (L)  08/09/2021   PLT 358 08/09/2021   Lab Results  Component Value Date   IRON 38 10/03/2021   TIBC 349 10/03/2021   FERRITIN 13 (L) 10/03/2021   Attestation Statements:   Reviewed by clinician on day of visit: allergies, medications, problem list, medical history, surgical history, family history, social history, and previous encounter notes.  Time spent on visit including pre-visit chart review and post-visit care and charting was 42 minutes.   I, Fortino Sic, RMA am acting as transcriptionist for Reuben Likes, MD.  I have reviewed the above documentation for accuracy and completeness, and I agree with the above. - Reuben Likes, MD

## 2021-10-23 NOTE — Telephone Encounter (Signed)
Patient is scheduled for 10/30/21.

## 2021-10-25 ENCOUNTER — Encounter (INDEPENDENT_AMBULATORY_CARE_PROVIDER_SITE_OTHER): Payer: Self-pay

## 2021-10-30 ENCOUNTER — Encounter: Payer: Self-pay | Admitting: Nurse Practitioner

## 2021-10-30 ENCOUNTER — Ambulatory Visit (INDEPENDENT_AMBULATORY_CARE_PROVIDER_SITE_OTHER): Payer: 59 | Admitting: Nurse Practitioner

## 2021-10-30 VITALS — BP 102/68 | HR 83

## 2021-10-30 DIAGNOSIS — Z01812 Encounter for preprocedural laboratory examination: Secondary | ICD-10-CM

## 2021-10-30 DIAGNOSIS — Z30430 Encounter for insertion of intrauterine contraceptive device: Secondary | ICD-10-CM

## 2021-10-30 DIAGNOSIS — N939 Abnormal uterine and vaginal bleeding, unspecified: Secondary | ICD-10-CM

## 2021-10-30 LAB — PREGNANCY, URINE: Preg Test, Ur: NEGATIVE

## 2021-10-30 MED ORDER — LEVONORGESTREL 20 MCG/DAY IU IUD: 1.0000 | INTRAUTERINE_SYSTEM | Freq: Once | INTRAUTERINE | Status: AC

## 2021-10-30 NOTE — Progress Notes (Signed)
   Stephanie Cohen 12-16-2000 [id number]   History:  21 y.o. G0 presents for insertion of Mirena IUD.  Pt has been counseled about risks and benefits as well as complications.  Consent is obtained today.  No LMP recorded. (Menstrual status: Irregular Periods). STD testing: None, not sexually active  Past medical history, past surgical history, family history and social history were all reviewed and documented in the EPIC chart.  ROS:  A ROS was performed and pertinent positives and negatives are included.  Exam: Vitals:   10/30/21 1406  BP: 102/68  Pulse: 83  SpO2: 98%   There is no height or weight on file to calculate BMI.  Pelvic exam: Vulva:  normal female genitalia Vagina:  normal vagina, no discharge, exudate, lesion, or erythema Cervix:  Non-tender, Negative CMT, no lesions or redness. Uterus:  normal shape, position and consistency    Procedure:  Speculum inserted.   Cervix visualized and cleansed with Betadine x 3.  Tenaculum placed on anterior cervix. Then uterus sounded to 8 cm. IUD inserted easily, although first IUD was pulled out with string cutting, second inserted easily as well. Strings trimmed to 3 cm.  Minimal bleeding noted.  Pt tolerated the procedure well.  Chaperone present: Virgie Dad, CMA   Assessment/Plan:  Insertion of Mirena IUD             UPT neg   Return for recheck 4-6 weeks Pt aware to call for any concerns Pt aware removal due no later than 8 years from insertion date, IUD card given to pt.   Olivia Mackie DNP, 2:38 PM 10/30/2021

## 2021-10-31 ENCOUNTER — Encounter (INDEPENDENT_AMBULATORY_CARE_PROVIDER_SITE_OTHER): Payer: Self-pay | Admitting: Family Medicine

## 2021-10-31 ENCOUNTER — Ambulatory Visit (INDEPENDENT_AMBULATORY_CARE_PROVIDER_SITE_OTHER): Payer: 59 | Admitting: Family Medicine

## 2021-10-31 VITALS — BP 122/77 | HR 86 | Temp 98.5°F | Ht 65.0 in | Wt 300.0 lb

## 2021-10-31 DIAGNOSIS — E8881 Metabolic syndrome: Secondary | ICD-10-CM | POA: Diagnosis not present

## 2021-10-31 DIAGNOSIS — Z6841 Body Mass Index (BMI) 40.0 and over, adult: Secondary | ICD-10-CM

## 2021-10-31 DIAGNOSIS — E559 Vitamin D deficiency, unspecified: Secondary | ICD-10-CM

## 2021-10-31 DIAGNOSIS — E669 Obesity, unspecified: Secondary | ICD-10-CM

## 2021-10-31 DIAGNOSIS — E611 Iron deficiency: Secondary | ICD-10-CM | POA: Diagnosis not present

## 2021-10-31 DIAGNOSIS — Z7984 Long term (current) use of oral hypoglycemic drugs: Secondary | ICD-10-CM

## 2021-10-31 MED ORDER — VITAMIN D (ERGOCALCIFEROL) 1.25 MG (50000 UNIT) PO CAPS: 50000.0000 [IU] | ORAL_CAPSULE | ORAL | 0 refills | Status: DC

## 2021-10-31 MED ORDER — METFORMIN HCL 500 MG PO TABS: 500.0000 mg | ORAL_TABLET | Freq: Every day | ORAL | 0 refills | Status: DC

## 2021-11-08 NOTE — Progress Notes (Signed)
Chief Complaint:   OBESITY Stephanie Cohen is here to discuss her progress with her obesity treatment plan along with follow-up of her obesity related diagnoses. Stephanie Cohen is on the Category 3 Plan and states she is following her eating plan approximately 45% of the time. Stephanie Cohen states she is doing 0 minutes 0 times per week.  Today's visit was #: 3 Starting weight: 304 lbs Starting date: 10/03/2021 Today's weight: 300 lbs Today's date: 10/31/2021 Total lbs lost to date: 4 Total lbs lost since last in-office visit: 0  Interim History: Stephanie Cohen's net weight loss is 4 pounds in 1 month since her first visit.  She works night shifts.  Tends to eat 2 meals per day.  Sleeps 9 AM to about 3 PM, and works 7 PM to 7 AM.  Diamox tends to make her less hungry.  She started doing hello fresh.  She is learning to cook more at home.  Tired of category 3 foods.  Has cut back on meals out.  Subjective:   1. Insulin resistance Stephanie Cohen started on metformin 500 mg daily with food, and she is tolerating it well.  She has decreased her sugar intake.  2. Vitamin D deficiency Stephanie Cohen's last vitamin D level was 13.2 on 10/03/2021.  She is on vitamin D prescription 50,000 units once weekly.  3. Iron deficiency Stephanie Cohen was seen by her GYN and IUD was placed.  She has a history of heavy menses.  She was evaluated by Hematology, pending.  She is currently not on iron supplementation.  Assessment/Plan:   1. Insulin resistance Stephanie Cohen will continue metformin 500 mg once daily with breakfast, and we will refill for 1 month.  She will continue to decrease her sugar intake, and work on increasing her walking time.  - metFORMIN (GLUCOPHAGE) 500 MG tablet; Take 1 tablet (500 mg total) by mouth daily with breakfast.  Dispense: 30 tablet; Refill: 0  2. Vitamin D deficiency Stephanie Cohen will continue prescription vitamin D 50,000 units once weekly, and we will refill for 1 month.  We will recheck vitamin D level in 2 to 3  months.  - Vitamin D, Ergocalciferol, (DRISDOL) 1.25 MG (50000 UNIT) CAPS capsule; Take 1 capsule (50,000 Units total) by mouth every 7 (seven) days.  Dispense: 4 capsule; Refill: 0  3. Iron deficiency We will follow-up on Hematology visit.   4. Obesity with current BMI of 50.0 Stephanie Cohen is currently in the action stage of change. As such, her goal is to continue with weight loss efforts. She has agreed to the Category 3 Plan.   Eating out handout was given.  Foods/meals alternatives/plate method was discussed.  Exercise goals: Start tracking steps.   Behavioral modification strategies: increasing lean protein intake, increasing water intake, decreasing eating out, no skipping meals, keeping healthy foods in the home, better snacking choices, and decreasing junk food.  Stephanie Cohen has agreed to follow-up with our clinic in 3 weeks. She was informed of the importance of frequent follow-up visits to maximize her success with intensive lifestyle modifications for her multiple health conditions.   Objective:   Blood pressure 122/77, pulse 86, temperature 98.5 F (36.9 C), height 5\' 5"  (1.651 m), weight 300 lb (136.1 kg), SpO2 99 %. Body mass index is 49.92 kg/m.  General: Cooperative, alert, well developed, in no acute distress. HEENT: Conjunctivae and lids unremarkable. Cardiovascular: Regular rhythm.  Lungs: Normal work of breathing. Neurologic: No focal deficits.   Lab Results  Component Value Date   CREATININE 0.79  08/10/2021   BUN 11 08/10/2021   NA 134 (L) 08/10/2021   K 3.7 08/10/2021   CL 110 08/10/2021   CO2 20 (L) 08/10/2021   Lab Results  Component Value Date   ALT 18 08/09/2021   AST 25 08/09/2021   ALKPHOS 53 08/09/2021   BILITOT 0.3 08/09/2021   Lab Results  Component Value Date   HGBA1C 5.4 10/03/2021   Lab Results  Component Value Date   INSULIN 17.7 10/03/2021   Lab Results  Component Value Date   TSH 3.120 10/03/2021   Lab Results  Component Value  Date   CHOL 135 10/03/2021   HDL 43 10/03/2021   LDLCALC 78 10/03/2021   TRIG 68 10/03/2021   Lab Results  Component Value Date   VD25OH 13.2 (L) 10/03/2021   Lab Results  Component Value Date   WBC 12.6 (H) 08/09/2021   HGB 11.9 (L) 08/09/2021   HCT 38.1 10/03/2021   MCV 62.0 (L) 08/09/2021   PLT 358 08/09/2021   Lab Results  Component Value Date   IRON 38 10/03/2021   TIBC 349 10/03/2021   FERRITIN 13 (L) 10/03/2021   Attestation Statements:   Reviewed by clinician on day of visit: allergies, medications, problem list, medical history, surgical history, family history, social history, and previous encounter notes.   Trude Mcburney, am acting as transcriptionist for Seymour Bars, DO.  I have reviewed the above documentation for accuracy and completeness, and I agree with the above. Glennis Brink, DO

## 2021-11-09 ENCOUNTER — Ambulatory Visit: Payer: 59 | Admitting: Neurology

## 2021-11-10 ENCOUNTER — Other Ambulatory Visit (INDEPENDENT_AMBULATORY_CARE_PROVIDER_SITE_OTHER): Payer: Self-pay | Admitting: Family Medicine

## 2021-11-10 DIAGNOSIS — E8881 Metabolic syndrome: Secondary | ICD-10-CM

## 2021-11-13 ENCOUNTER — Inpatient Hospital Stay: Payer: 59

## 2021-11-13 ENCOUNTER — Other Ambulatory Visit: Payer: Self-pay

## 2021-11-13 ENCOUNTER — Inpatient Hospital Stay: Payer: 59 | Attending: Hematology and Oncology | Admitting: Hematology and Oncology

## 2021-11-13 VITALS — BP 110/73 | HR 72 | Temp 97.7°F | Resp 18 | Wt 300.2 lb

## 2021-11-13 DIAGNOSIS — E282 Polycystic ovarian syndrome: Secondary | ICD-10-CM | POA: Diagnosis not present

## 2021-11-13 DIAGNOSIS — D500 Iron deficiency anemia secondary to blood loss (chronic): Secondary | ICD-10-CM

## 2021-11-13 LAB — IRON AND IRON BINDING CAPACITY (CC-WL,HP ONLY)
Iron: 14 ug/dL — ABNORMAL LOW (ref 28–170)
Saturation Ratios: 4 % — ABNORMAL LOW (ref 10.4–31.8)
TIBC: 385 ug/dL (ref 250–450)
UIBC: 371 ug/dL (ref 148–442)

## 2021-11-13 LAB — CMP (CANCER CENTER ONLY)
ALT: 11 U/L (ref 0–44)
AST: 16 U/L (ref 15–41)
Albumin: 4.1 g/dL (ref 3.5–5.0)
Alkaline Phosphatase: 51 U/L (ref 38–126)
Anion gap: 5 (ref 5–15)
BUN: 14 mg/dL (ref 6–20)
CO2: 17 mmol/L — ABNORMAL LOW (ref 22–32)
Calcium: 8.9 mg/dL (ref 8.9–10.3)
Chloride: 116 mmol/L — ABNORMAL HIGH (ref 98–111)
Creatinine: 0.7 mg/dL (ref 0.44–1.00)
GFR, Estimated: >60 mL/min (ref >60)
Glucose, Bld: 95 mg/dL (ref 70–99)
Potassium: 4.2 mmol/L (ref 3.5–5.1)
Sodium: 138 mmol/L (ref 135–145)
Total Bilirubin: 0.3 mg/dL (ref 0.3–1.2)
Total Protein: 7.4 g/dL (ref 6.5–8.1)

## 2021-11-13 LAB — CBC WITH DIFFERENTIAL (CANCER CENTER ONLY)
Abs Immature Granulocytes: 0.03 10*3/uL (ref 0.00–0.07)
Basophils Absolute: 0.1 10*3/uL (ref 0.0–0.1)
Basophils Relative: 1 %
Eosinophils Absolute: 0.2 10*3/uL (ref 0.0–0.5)
Eosinophils Relative: 2 %
HCT: 33.8 % — ABNORMAL LOW (ref 36.0–46.0)
Hemoglobin: 10.9 g/dL — ABNORMAL LOW (ref 12.0–15.0)
Immature Granulocytes: 0 %
Lymphocytes Relative: 35 %
Lymphs Abs: 3.5 10*3/uL (ref 0.7–4.0)
MCH: 19 pg — ABNORMAL LOW (ref 26.0–34.0)
MCHC: 32.2 g/dL (ref 30.0–36.0)
MCV: 58.8 fL — ABNORMAL LOW (ref 80.0–100.0)
Monocytes Absolute: 0.6 10*3/uL (ref 0.1–1.0)
Monocytes Relative: 6 %
Neutro Abs: 5.7 10*3/uL (ref 1.7–7.7)
Neutrophils Relative %: 56 %
Platelet Count: 392 10*3/uL (ref 150–400)
RBC: 5.75 MIL/uL — ABNORMAL HIGH (ref 3.87–5.11)
RDW: 18.2 % — ABNORMAL HIGH (ref 11.5–15.5)
Smear Review: NORMAL
WBC Count: 10.2 10*3/uL (ref 4.0–10.5)
nRBC: 0 % (ref 0.0–0.2)

## 2021-11-13 LAB — RETIC PANEL
Immature Retic Fract: 28.8 % — ABNORMAL HIGH (ref 2.3–15.9)
RBC.: 5.8 MIL/uL — ABNORMAL HIGH (ref 3.87–5.11)
Retic Count, Absolute: 91.1 10*3/uL (ref 19.0–186.0)
Retic Ct Pct: 1.6 % (ref 0.4–3.1)
Reticulocyte Hemoglobin: 19.6 pg — ABNORMAL LOW (ref >27.9)

## 2021-11-13 NOTE — Progress Notes (Signed)
[facility] [phone]   [phone]  INITIAL CONSULT NOTE  Patient Care Team: Patient, No Pcp Per as PCP - General (General Practice)  Hematological/Oncological History # Iron Deficiency Anemia 2/2 to GYN Bleeding 08/09/2021: WBC 12.6, Hgb 11.9, MCV 62, Plt 358 10/03/2021: Ferritin 13, Iron sat 11% 11/13/2021: establish care with Dr. Leonides Schanz   CHIEF COMPLAINTS/PURPOSE OF CONSULTATION:  "Iron Deficiency Anemia 2/2 to GYN Bleeding "  HISTORY OF PRESENTING ILLNESS:  Stephanie Cohen 21 y.o. female with medical history significant for PCOS, asthma, and ADHD who presents for evaluation of a microcytic anemia.  On review of the previous records Stephanie Cohen had labs drawn on 08/09/2021 at which time showed white blood cell count 12.6, hemoglobin 1.9, MCV 62, and platelets of 358.  On 10/03/2021 she had iron studies drawn which showed a ferritin of 13 and iron sat 11%.  Due to concern for these findings the patient was referred to hematology for further evaluation and management.  On exam today Stephanie Cohen notes that she has heavy menstrual cycles and that she has PCOS.  She reports that she had an IUD placed 2 weeks ago due to having a menstrual cycle which lasted from January all the way until August.  It was a consistent heavy bleeding throughout the entire duration of that time.  The patient reports that she has had heavy menstrual cycles since she went through puberty.  She notes that her most recent heavy cycle required Megace in order to help slow it down.  The patient reports that she does eat red meat and does not have any dietary restrictions.  She notes that she has donated plasma before in the past and has been previously told that she had iron deficiency.  She does that she is not having any bleeding elsewhere.  She denies any bleeding, nosebleeding, or blood in her stool or urine.  On further discussion she reports her family history is  remarkable for breast cancer on her maternal grandfather side of the family.  She is a mother and sister both of whom are healthy.  She is unsure of her father side of the family.  She is a never smoker and only drinks socially.  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  She endorses having fatigue and shortness of breath.  She notes that she does have ice cravings though "not often".  She otherwise denies any fevers, chills, sweats, nausea, ming or diarrhea.  A full 10 point ROS is listed below.  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  MEDICATIONS:  Current Outpatient Medications  Medication Sig Dispense Refill   acetaZOLAMIDE (DIAMOX) 250 MG tablet Start with 250mg  twice daily. In 1-2 weeks increase to 250mg  in the morning and 500mg  at bedtime. In 1-2 weeks increase to 500mg  twice daily. 360 tablet 3   cetirizine (ZYRTEC) 10 MG tablet Take 10 mg by mouth 2 (two) times daily.     metFORMIN (GLUCOPHAGE) 500 MG tablet Take 1 tablet (500 mg total) by mouth daily with breakfast. 30 tablet 0   rizatriptan (MAXALT-MLT) 10 MG disintegrating tablet Take 1 tablet (10 mg total) by mouth as needed for migraine. May repeat in 2 hours if needed 9 tablet 11   Vitamin D, Ergocalciferol, (DRISDOL) 1.25 MG (50000 UNIT) CAPS capsule Take 1 capsule (50,000 Units total) by mouth every 7 (seven) days. 4 capsule 0   Current Facility-Administered Medications  Medication Dose Route Frequency Provider Last Rate Last Admin   levonorgestrel (MIRENA) 20 MCG/DAY IUD 1 each  1 each Intrauterine Once A, NP        REVIEW OF SYSTEMS:   Constitutional: ( - ) fevers, ( - )  chills , ( - ) night sweats Eyes: ( - ) blurriness of vision, ( - ) double vision, ( - ) watery eyes Ears, nose, mouth, throat, and face: ( - ) mucositis, ( - ) sore throat Respiratory: ( - ) cough, ( - ) dyspnea, ( - ) wheezes Cardiovascular: ( - ) palpitation, ( - ) chest discomfort, ( - ) lower extremity swelling Gastrointestinal:  ( - ) nausea, ( - ) heartburn, ( - ) change in bowel habits Skin: ( - ) abnormal skin rashes Lymphatics: ( - ) new lymphadenopathy, ( - ) easy bruising Neurological: ( - ) numbness, ( - ) tingling, ( - ) new weaknesses Behavioral/Psych: ( - ) mood change, ( - ) new changes  All other systems were reviewed with the patient and are negative.  PHYSICAL EXAMINATION:  Vitals:   11/13/21 1404  BP:  110/73  Pulse: 72  Resp: 18  Temp: 97.7 F (36.5 C)  SpO2: 100%   Filed Weights   11/13/21 1404  Weight: (!) 300 lb 3.2 oz (136.2 kg)    GENERAL: well appearing young African-American female in NAD  SKIN: skin color, texture, turgor are normal, no rashes or significant lesions EYES: conjunctiva are pink and non-injected, sclera clear LUNGS: clear to auscultation and percussion with normal breathing effort HEART: regular rate & rhythm and no murmurs and no lower extremity edema Musculoskeletal: no cyanosis of digits and no clubbing  PSYCH: alert &  oriented x 3, fluent speech NEURO: no focal motor/sensory deficits  LABORATORY DATA:  I have reviewed the data as listed    Latest Ref Rng & Units 11/13/2021    2:34 PM 10/03/2021   10:06 AM 08/09/2021    1:13 AM  CBC  WBC 4.0 - 10.5 K/uL 10.2   12.6   Hemoglobin 12.0 - 15.0 g/dL 89.3   73.4   Hematocrit 36.0 - 46.0 % 33.8  38.1  36.5   Platelets 150 - 400 K/uL 392   358        Latest Ref Rng & Units 11/13/2021    2:34 PM 08/10/2021    3:31 AM 08/09/2021    1:13 AM  CMP  Glucose 70 - 99 mg/dL 95  87  287   BUN 6 - 20 mg/dL 14  11  9    Creatinine 0.44 - 1.00 mg/dL  6.81  1.57   Sodium 135 - 145 mmol/L 138  134  137   Potassium 3.5 - 5.1 mmol/L 4.2  3.7  3.8   Chloride 98 - 111 mmol/L 116  110  108   CO2 22 - 32 mmol/L 17  20  25    Calcium 8.9 - 10.3 mg/dL 8.9  8.8  9.0   Total Protein 6.5 - 8.1 g/dL 7.4   7.1   Total Bilirubin 0.3 - 1.2 mg/dL 0.3   0.3   Alkaline Phos 38 - 126 U/L 51   53   AST 15 - 41 U/L 16   25   ALT 0 - 44 U/L 11   18      ASSESSMENT & PLAN 2.62 21 y.o. female with medical history significant for PCOS, asthma, and ADHD who presents for evaluation of a microcytic anemia.  After review of the labs, review of the records, and discussion with the patient the patients findings are most consistent with iron deficiency anemia secondary to heavy menstrual bleeding.  Additionally the  patient has marked microcytosis which may be due to an underlying thalassemia.  As such I would recommend we do full thalassemia testing with hemoglobin fractionation cascade as well as alpha globulin gene testing.  The patient voiced understanding of the plan moving forward.  # Iron Deficiency Anemia 2/2 to GYN Bleeding -- Findings are consistent with iron deficiency anemia secondary to patient's menorrhagia --Encouraged her to follow-up with OB/GYN for better control of her menstrual cycles.  IUD placed on 10/30/2021. --We will confirm iron deficiency anemia by ordering iron panel and ferritin as well as reticulocytes, CBC, and CMP --start ferrous sulfate 325 mg daily with a source of vitamin C if iron levels are found to be low.  --If iron levels remain consistently low despite PO iron therapy will plan to proceed with IV iron therapy in order to help bolster the patient's blood counts --Plan for return to clinic in 3 months.   # Microcytosis -- Appears disproportionate to the level of anemia.  We will order thalassemia testing with hemoglobin fractionation cascade as well as alpha globulin gene testing.  Orders Placed This Encounter  Procedures   CBC with Differential (Cancer Center Only)    Standing Status:   Future    Number of Occurrences:   1    Standing Expiration Date:   11/14/2022   CMP (Cancer Center only)    Standing Status:   Future    Number of Occurrences:   1    Standing Expiration Date:  11/14/2022   Ferritin    Standing Status:   Future    Number of Occurrences:   1    Standing Expiration Date:   11/14/2022   Iron and Iron Binding Capacity ([facility] only)    Standing Status:   Future    Number of Occurrences:   1    Standing Expiration Date:   11/14/2022   Retic Panel    Standing Status:   Future    Number of Occurrences:   1    Standing Expiration Date:   11/14/2022   Hgb Fractionation Cascade    Standing Status:   Future    Number of Occurrences:   1     Standing Expiration Date:   11/14/2022   Alpha-Thalassemia GenotypR    Standing Status:   Future    Number of Occurrences:   1    Standing Expiration Date:   11/14/2022    All questions were answered. The patient knows to call the clinic with any problems, questions or concerns.  A total of more than 60 minutes were spent on this encounter with face-to-face time and non-face-to-face time, including preparing to see the patient, ordering tests and/or medications, counseling the patient and coordination of care as outlined above.   Ulysees Barns, MD Department of Hematology/Oncology [facility] at [facility] Phone: [phone] Pager: [phone] Email: [email or web address]  11/14/2021 9:50 AM

## 2021-11-14 ENCOUNTER — Other Ambulatory Visit: Payer: Self-pay | Admitting: *Deleted

## 2021-11-14 ENCOUNTER — Telehealth: Payer: Self-pay | Admitting: Hematology and Oncology

## 2021-11-14 LAB — FERRITIN: Ferritin: 11 ng/mL (ref 11–307)

## 2021-11-14 MED ORDER — FERROUS SULFATE 325 (65 FE) MG PO TABS
325.0000 mg | ORAL_TABLET | Freq: Every day | ORAL | 3 refills | Status: DC
Start: 2021-11-14 — End: 2022-02-26

## 2021-11-14 NOTE — Telephone Encounter (Signed)
Per 8/29 los called and spoke to pt about appointment  

## 2021-11-15 LAB — HGB FRACTIONATION CASCADE

## 2021-11-15 LAB — HGB FRACTIONATION BY HPLC
Hgb A2: 3.5 % — ABNORMAL HIGH (ref 1.8–3.2)
Hgb A: 71.1 % — ABNORMAL LOW (ref 96.4–98.8)
Hgb C: 25.4 % — ABNORMAL HIGH
Hgb E: 0 %
Hgb F: 0 % (ref 0.0–2.0)
Hgb S: 0 %
Hgb Variant: 0 %

## 2021-11-21 ENCOUNTER — Ambulatory Visit: Payer: 59 | Admitting: Neurology

## 2021-11-21 ENCOUNTER — Encounter: Payer: Self-pay | Admitting: Neurology

## 2021-11-21 ENCOUNTER — Telehealth: Payer: Self-pay | Admitting: Neurology

## 2021-11-21 VITALS — BP 103/48 | HR 75 | Ht 65.0 in | Wt 301.2 lb

## 2021-11-21 DIAGNOSIS — G43009 Migraine without aura, not intractable, without status migrainosus: Secondary | ICD-10-CM | POA: Diagnosis not present

## 2021-11-21 DIAGNOSIS — G932 Benign intracranial hypertension: Secondary | ICD-10-CM | POA: Diagnosis not present

## 2021-11-21 MED ORDER — UBRELVY 100 MG PO TABS: 100.0000 mg | ORAL_TABLET | ORAL | 11 refills | Status: DC | PRN

## 2021-11-21 MED ORDER — ACETAZOLAMIDE 250 MG PO TABS
750.0000 mg | ORAL_TABLET | Freq: Two times a day (BID) | ORAL | 3 refills | Status: DC
Start: 2021-11-21 — End: 2022-01-24

## 2021-11-21 NOTE — Telephone Encounter (Signed)
Sent to Dr. Groat ph # [phone] 

## 2021-11-21 NOTE — Progress Notes (Signed)
[facility]    Provider:  Dr Lucia Gaskins Requesting Provider: No ref. provider found Primary Care Provider:  Patient, No Pcp Per  CC:  IDIOPATHIC INTRACRANIAL HYPERTENSION   11/21/2021: Patient with IDIOPATHIC INTRACRANIAL HYPERTENSION, ran out of her medications. Sge was titrated back onto 500mg  twice daily of acetazolamide. No blackening os vision, no new vision problems, no wooshing of the ears, papilledema has improved today, still has npt gone to see dr she rescheduled her appointment with Dr. Dione Booze and I encouraged her to go. She is on 750mg  bid diamox. She has other types of headaches, pulsating pounding throbbing, light and sound sensitivity, moderate, a little queasy, movement makes it worse, different than the pressure. She has tried rizatriptan, sumatriptan, OTC meds, nothing helped acutely. No other focal neurologic deficits, associated symptoms, inciting events or modifiable factors.4 migraine days a months, < 10 total headache days a month. Needs to call Dr. Dione Booze for repeat eye exam.  Patient complains of symptoms per HPI as well as the following symptoms: migraines . Pertinent negatives and positives per HPI. All others negative    HPI:  Stephanie Cohen is a 21 y.o. female here as requested by No ref. provider found for IDIOPATHIC INTRACRANIAL HYPERTENSION. Sent to the emergency room from Dr. Melven Sartorius (Ophthalmology). She was on diamox and ran out. 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   She is feeling better. She has reduced appetite. She started having episodes of vision loss, wooshing in the ears, pressure headache. She has improved, no complete vision loss. She is feeling better now, headaches improved, swishing reduced, no more vison changes,  we discussed IDIOPATHIC INTRACRANIAL HYPERTENSION and treatment, risk of vision loss, they were very educated on the syndrome, discussed weight loss. Mother is her an provides much information. No other focal neurologic deficits, associated symptoms, inciting events or modifiable factors.   Reviewed notes, labs and imaging from outside physicians, which showed:  MRI 08/08/2021: IMPRESSION: MRI head:   No evidence of acute intracranial abnormality.   MRI of the orbits:   1. Question slight protrusion of the optic papilla bilaterally and mild prominence of the optic nerve sheaths, which is nonspecific but could potentially represent papilledema. Consider correlation with fundoscopic exam. 2. Strabismus.  CTV: IMPRESSION: No acute intracranial abnormality.   No dural venous sinus thrombosis.  Review of Systems: Patient complains of symptoms per HPI as well as the following symptoms headache impoved. Pertinent negatives and positives per HPI. All others negative.   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    Comment: Mirena insertion on 10/30/2021  Other Topics Concern   Not on file  Social History Narrative  Caffiene occasional Rare.     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 Strain: Not on [employer] Insecurity: Not on file  Transportation Needs: Not on file  Physical Activity: Not on file  Stress:  Not on file  Social Connections: Not on file  Intimate Partner Violence: Not on file    Family History  Problem Relation Age of Onset   Obesity Mother    Obesity Father    Asthma Father    Diabetes Father    Crohn's disease Maternal Grandmother    Diabetes Paternal Grandmother    Stroke Neg Hx     Past Medical History:  Diagnosis Date   ADHD    Asthma    Back pain    IIH (idiopathic intracranial hypertension)    Joint pain    PCOS (polycystic ovarian syndrome)     Patient Active Problem List   Diagnosis Date Noted   IIH (idiopathic intracranial hypertension) 11/21/2021   Migraine without aura and without status migrainosus, not intractable 11/21/2021   Pseudotumor cerebri 08/09/2021   Idiopathic intracranial hypertension 08/08/2021   Mild intermittent asthma without complication 08/08/2021   Class 3 severe obesity due to excess calories without serious comorbidity with body mass index (BMI) of 50.0 to 59.9 in adult [facility]) 08/08/2021   Leukocytosis 08/08/2021   History of ADHD 12/05/2020   Referred for medication therapy management 12/05/2020    Past Surgical History:  Procedure Laterality Date   NO PAST SURGERIES      Current Outpatient Medications  Medication Sig Dispense Refill   Aspirin-Acetaminophen-Caffeine (GOODY HEADACHE PO) Take by mouth.     cetirizine (ZYRTEC) 10 MG tablet Take 10 mg by mouth 2 (two) times daily.     ferrous sulfate 325 (65 FE) MG tablet Take 1 tablet (325 mg total) by mouth daily with breakfast. 30 tablet 3   metFORMIN (GLUCOPHAGE) 500 MG tablet Take 1 tablet (500 mg total) by mouth daily with breakfast. 30 tablet 0   rizatriptan (MAXALT-MLT) 10 MG disintegrating tablet Take 1 tablet (10 mg total) by mouth as needed for migraine. May repeat in 2 hours if needed 9 tablet 11   acetaZOLAMIDE (DIAMOX) 250 MG tablet Take 3 tablets (750 mg total) by mouth 2 (two) times daily. 540 tablet 3   Ubrogepant (UBRELVY) 100 MG TABS Take 100 mg by mouth  every 2 (two) hours as needed. Maximum 200mg  a day. 16 tablet 11   Current Facility-Administered Medications  Medication Dose Route Frequency Provider Last Rate Last Admin   levonorgestrel (MIRENA) 20 MCG/DAY IUD 1 each  1 each Intrauterine Once A, NP        Allergies as of 11/21/2021 - Review Complete 11/21/2021  Allergen Reaction Noted   Antihistamines, loratadine-type Hives 11/13/2021    Vitals: BP (!) 103/48   Pulse 75   Ht 5\' 5"  (1.651 m)   Wt (!) 301 lb 3.2 oz (136.6 kg)   BMI 50.12 kg/m  Last Weight:  Wt Readings from Last 1 Encounters:  11/21/21 (!) 301 lb 3.2 oz (136.6 kg)   Last Height:   Ht Readings from Last 1 Encounters:  11/21/21 5\' 5"  (1.651 m)    Exam: NAD, pleasant                  Speech:    Speech is normal; fluent and spontaneous with normal comprehension.  Cognition:    The patient is  oriented to person, place, and time;     recent and remote memory intact;     language fluent;    Cranial Nerves:    The pupils are equal, round, and reactive to light. Papilledema improved. Trigeminal sensation is intact and the muscles of mastication are normal. The face is symmetric. The palate elevates in the midline. Hearing intact. Voice is normal. Shoulder shrug is normal. The tongue has normal motion without fasciculations.   Coordination:  No dysmetria  Motor Observation:    No asymmetry, no atrophy, and no involuntary movements noted. Tone:    Normal muscle tone.     Strength:    Strength is V/V in the upper and lower limbs.      Sensation: intact to LT     Assessment/Plan:  patient with IDIOPATHIC INTRACRANIAL HYPERTENSION; opening pressure on LP was 36, MRI showed mild prominence of the optic nerve sheaths, fundoscopic exam showed grade 3 papilledema and vision loss, CTV showed no stenosis or sinus thrombosis, she was restarted on her diamox.  Symptoms and papilledema improved. She is on 750mg  BID and doing well but having episodic  migraines.  Make an appointment with Dr. to check vision   Continue diamox  [facility]: she saw Dr. Dione Booze on 10/17/2021 and she was rescheduled.now seeing another doctor last seen 10/31/2021. Weight loss is critical, discussed weight loss and risk of permanent vision loss, she is [facility] the [facility]  She denies any symptoms of sleep apnea, her vision is improved, now strugglling with episodic migraine, will order 11/02/2021  Patient has an IUD, discussed association, but she had IDIOPATHIC INTRACRANIAL HYPERTENSION much before the IUD     For any acute change especially worsening headache or vision loss call 911 and proceed to ED Orders Placed This Encounter  Procedures   Ambulatory referral to Ophthalmology    Meds ordered this encounter  Medications   DISCONTD: Ubrogepant (UBRELVY) 100 MG TABS    Sig: Take 100 mg by mouth every 2 (two) hours as needed. Maximum 200mg  a day.    Dispense:  16 tablet    Refill:  11    She has tried rizatriptan, sumatriptan, OTC meds, nothing helped acutely. 4 migraine days a months, < 10 total headache days a month.   Ubrogepant (UBRELVY) 100 MG TABS    Sig: Take 100 mg by mouth every 2 (two) hours as needed. Maximum 200mg  a day.    Dispense:  16 tablet    Refill:  11    She has tried rizatriptan, sumatriptan, OTC meds, nothing helped acutely. 4 migraine days a months, < 10 total headache days a month.   acetaZOLAMIDE (DIAMOX) 250 MG tablet    Sig: Take 3 tablets (750 mg total) by mouth 2 (two) times daily.    Dispense:  540 tablet    Refill:  3    Cc: No ref. provider found,  Patient, No Pcp Per  Bernita Raisin, MD  [facility] [address] [place], [facility] [address]  Phone [phone] Fax [phone]   I spent over 35 minutes of face-to-face and non-face-to-face time with patient on the  1. IIH (idiopathic intracranial hypertension)   2. Migraine  without aura and without status migrainosus, not intractable     diagnosis.  This included previsit chart review, lab review, study review, order entry, electronic health record documentation, patient education on the different diagnostic and therapeutic options, counseling and  coordination of care, risks and benefits of management, compliance, or risk factor reduction

## 2021-11-21 NOTE — Patient Instructions (Addendum)
Continue 750mg  twice daili diamox Consider Wegovy talk to [facility]  Start Ubelvy prn Refer back to Dr. Katy Fitch  Ubrogepant Tablets What is this medication? UBROGEPANT (ue BROE je pant) treats migraines. It works by blocking a substance in the body that causes migraines. It is not used to prevent migraines. This medicine may be used for other purposes; ask your health care provider or pharmacist if you have questions. COMMON BRAND NAME(S): Roselyn Meier What should I tell my care team before I take this medication? They need to know if you have any of these conditions: Kidney disease Liver disease An unusual or allergic reaction to ubrogepant, other medications, foods, dyes, or preservatives Pregnant or trying to get pregnant Breast-feeding How should I use this medication? Take this medication by mouth with a glass of water. Take it as directed on the prescription label. You can take it with or without food. If it upsets your stomach, take it with food. Keep taking it unless your care team tells you to stop. Talk to your care team about the use of this medication in children. Special care may be needed. Overdosage: If you think you have taken too much of this medicine contact a poison control center or emergency room at once. NOTE: This medicine is only for you. Do not share this medicine with others. What if I miss a dose? This does not apply. This medication is not for regular use. What may interact with this medication? Do not take this medication with any of the following: Adagrasib Ceritinib Certain antibiotics, such as chloramphenicol, clarithromycin, telithromycin Certain antivirals for HIV, such as atazanavir, cobicistat, darunavir, delavirdine, fosamprenavir, indinavir, ritonavir Certain medications for fungal infections, such as itraconazole, ketoconazole, posaconazole, voriconazole Conivaptan Grapefruit Idelalisib Mifepristone Nefazodone Ribociclib This medication may also  interact with the following: Carvedilol Certain medications for seizures, such as phenobarbital, phenytoin Ciprofloxacin Cyclosporine Eltrombopag Fluconazole Fluvoxamine Quinidine Rifampin St. John's wort Verapamil This list may not describe all possible interactions. Give your health care provider a list of all the medicines, herbs, non-prescription drugs, or dietary supplements you use. Also tell them if you smoke, drink alcohol, or use illegal drugs. Some items may interact with your medicine. What should I watch for while using this medication? Visit your care team for regular checks on your progress. Tell your care team if your symptoms do not start to get better or if they get worse. Your mouth may get dry. Chewing sugarless gum or sucking hard candy and drinking plenty of water may help. Contact your care team if the problem does not go away or is severe. What side effects may I notice from receiving this medication? Side effects that you should report to your care team as soon as possible: Allergic reactions--skin rash, itching, hives, swelling of the face, lips, tongue, or throat Side effects that usually do not require medical attention (report to your care team if they continue or are bothersome): Drowsiness Dry mouth Fatigue Nausea This list may not describe all possible side effects. Call your doctor for medical advice about side effects. You may report side effects to FDA at 1-800-FDA-1088. Where should I keep my medication? Keep out of the reach of children and pets. Store between 15 and 30 degrees C (59 and 86 degrees F). Get rid of any unused medication after the expiration date. To get rid of medications that are no longer needed or have expired: Take the medication to a medication take-back program. Check with your pharmacy or law enforcement to  find a location. If you cannot return the medication, check the label or package insert to see if the medication should be  thrown out in the garbage or flushed down the toilet. If you are not sure, ask your care team. If it is safe to put it in the trash, pour the medication out of the container. Mix the medication with cat litter, dirt, coffee grounds, or other unwanted substance. Seal the mixture in a bag or container. Put it in the trash. NOTE: This sheet is a summary. It may not cover all possible information. If you have questions about this medicine, talk to your doctor, pharmacist, or health care provider.  2023 Elsevier/Gold Standard (2021-03-17 00:00:00) Migraine Headache A migraine headache is an intense, throbbing pain on one side or both sides of the head. Migraine headaches may also cause other symptoms, such as nausea, vomiting, and sensitivity to light and noise. A migraine headache can last from 4 hours to 3 days. Talk with your doctor about what things may bring on (trigger) your migraine headaches. What are the causes? The exact cause of this condition is not known. However, a migraine may be caused when nerves in the brain become irritated and release chemicals that cause inflammation of blood vessels. This inflammation causes pain. This condition may be triggered or caused by: Drinking alcohol. Smoking. Taking medicines, such as: Medicine used to treat chest pain (nitroglycerin). Birth control pills. Estrogen. Certain blood pressure medicines. Eating or drinking products that contain nitrates, glutamate, aspartame, or tyramine. Aged cheeses, chocolate, or caffeine may also be triggers. Doing physical activity. Other things that may trigger a migraine headache include: Menstruation. Pregnancy. Hunger. Stress. Lack of sleep or too much sleep. Weather changes. Fatigue. What increases the risk? The following factors may make you more likely to experience migraine headaches: Being a certain age. This condition is more common in people who are 6-23 years old. Being female. Having a family  history of migraine headaches. Being Caucasian. Having a mental health condition, such as depression or anxiety. Being obese. What are the signs or symptoms? The main symptom of this condition is pulsating or throbbing pain. This pain may: Happen in any area of the head, such as on one side or both sides. Interfere with daily activities. Get worse with physical activity. Get worse with exposure to bright lights or loud noises. Other symptoms may include: Nausea. Vomiting. Dizziness. General sensitivity to bright lights, loud noises, or smells. Before you get a migraine headache, you may get warning signs (an aura). An aura may include: Seeing flashing lights or having blind spots. Seeing bright spots, halos, or zigzag lines. Having tunnel vision or blurred vision. Having numbness or a tingling feeling. Having trouble talking. Having muscle weakness. Some people have symptoms after a migraine headache (postdromal phase), such as: Feeling tired. Difficulty concentrating. How is this diagnosed? A migraine headache can be diagnosed based on: Your symptoms. A physical exam. Tests, such as: CT scan or an MRI of the head. These imaging tests can help rule out other causes of headaches. Taking fluid from the spine (lumbar puncture) and analyzing it (cerebrospinal fluid analysis, or CSF analysis). How is this treated? This condition may be treated with medicines that: Relieve pain. Relieve nausea. Prevent migraine headaches. Treatment for this condition may also include: Acupuncture. Lifestyle changes like avoiding foods that trigger migraine headaches. Biofeedback. Cognitive behavioral therapy. Follow these instructions at home: Medicines Take over-the-counter and prescription medicines only as told by your health care  provider. Ask your health care provider if the medicine prescribed to you: Requires you to avoid driving or using heavy machinery. Can cause constipation. You  may need to take these actions to prevent or treat constipation: Drink enough fluid to keep your urine pale yellow. Take over-the-counter or prescription medicines. Eat foods that are high in fiber, such as beans, whole grains, and fresh fruits and vegetables. Limit foods that are high in fat and processed sugars, such as fried or sweet foods. Lifestyle Do not drink alcohol. Do not use any products that contain nicotine or tobacco, such as cigarettes, e-cigarettes, and chewing tobacco. If you need help quitting, ask your health care provider. Get at least 8 hours of sleep every night. Find ways to manage stress, such as meditation, deep breathing, or yoga. General instructions     Keep a journal to find out what may trigger your migraine headaches. For example, write down: What you eat and drink. How much sleep you get. Any change to your diet or medicines. If you have a migraine headache: Avoid things that make your symptoms worse, such as bright lights. It may help to lie down in a dark, quiet room. Do not drive or use heavy machinery. Ask your health care provider what activities are safe for you while you are experiencing symptoms. Keep all follow-up visits as told by your health care provider. This is important. Contact a health care provider if: You develop symptoms that are different or more severe than your usual migraine headache symptoms. You have more than 15 headache days in one month. Get help right away if: Your migraine headache becomes severe. Your migraine headache lasts longer than 72 hours. You have a fever. You have a stiff neck. You have vision loss. Your muscles feel weak or like you cannot control them. You start to lose your balance often. You have trouble walking. You faint. You have a seizure. Summary A migraine headache is an intense, throbbing pain on one side or both sides of the head. Migraines may also cause other symptoms, such as nausea,  vomiting, and sensitivity to light and noise. This condition may be treated with medicines and lifestyle changes. You may also need to avoid certain things that trigger a migraine headache. Keep a journal to find out what may trigger your migraine headaches. Contact your health care provider if you have more than 15 headache days in a month or you develop symptoms that are different or more severe than your usual migraine headache symptoms. This information is not intended to replace advice given to you by your health care provider. Make sure you discuss any questions you have with your health care provider. Document Revised: 06/27/2018 Document Reviewed: 04/17/2018 Elsevier Patient Education  2023 Elsevier Inc. Idiopathic Intracranial Hypertension  Idiopathic intracranial hypertension (IIH) is a condition that increases pressure around the brain. The fluid that surrounds the brain and spinal cord (cerebrospinal fluid, or CSF) increases and causes the pressure. Idiopathic means that the cause of this condition is not known. IIH affects the brain and spinal cord (neurological disorder). If this condition is not treated, it can cause vision loss or blindness. What are the causes? The cause of this condition is not known. What increases the risk? The following factors may make you more likely to develop this condition: Being very overweight (obese). Being a female between the ages of 82 and 49 years old, who has not gone through menopause. Taking certain medicines, such as birth control or  steroids. What are the signs or symptoms? Symptoms of this condition include: Headaches. This is the most common symptom. Brief episodes of total blindness. Double vision, blurred vision, or poor side (peripheral) vision. Pain in the shoulders or neck. Nausea and vomiting. A sound like rushing water or a pulsing sound within the ears (pulsatile tinnitus), or ringing in the ears. How is this diagnosed? This  condition may be diagnosed based on: Your symptoms and medical history. Imaging tests of the brain, such as: CT scan. MRI. Magnetic resonance venogram (MRV) to check the veins. Diagnostic lumbar puncture. This is a procedure to remove and examine a sample of cerebrospinal fluid. This procedure can determine whether too much fluid may be causing IIH. A thorough eye exam to check for swelling or nerve damage in the eyes. How is this treated? Treatment for this condition depends on the symptoms. The goal of treatment is to decrease the pressure around your brain. Common treatments include: Weight loss through healthy eating, salt restriction, and exercise, if you are overweight. Medicines to decrease the production of spinal fluid and lower the pressure within your skull. Medicines to prevent or treat headaches. Other treatments may include: Surgery to place drains (shunts) in your brain for removing excess fluid. Lumbar puncture to remove excess cerebrospinal fluid. Follow these instructions at home: If you are overweight or obese, work with your health care provider to lose weight. Take over-the-counter and prescription medicines only as told by your health care provider. Ask your health care provider if the medicine prescribed to you requires you to avoid driving or using machinery. Do not use any products that contain nicotine or tobacco, such as cigarettes, e-cigarettes, and chewing tobacco. If you need help quitting, ask your health care provider. Keep all follow-up visits as told by your health care provider. This is important. Contact a health care provider if: You have changes in your vision, such as: Double vision. Blurred vision. Poor peripheral vision. Get help right away if: You have any of the following symptoms and they get worse or do not get better: Headaches. Nausea. Vomiting. Sudden trouble seeing. Summary Idiopathic intracranial hypertension (IIH) is a condition  that increases pressure around the brain. The cause is not known (is idiopathic). The most common symptom of IIH is headaches. Vision changes, pain in the shoulders or neck, nausea, and vomiting may also occur. Treatment for this condition depends on your symptoms. The goal of treatment is to decrease the pressure around your brain. If you are overweight or obese, work with your health care provider to lose weight. Take over-the-counter and prescription medicines only as told by your health care provider. This information is not intended to replace advice given to you by your health care provider. Make sure you discuss any questions you have with your health care provider. Document Revised: 02/14/2019 Document Reviewed: 02/14/2019 Elsevier Patient Education  [place].

## 2021-11-27 ENCOUNTER — Telehealth: Payer: Self-pay

## 2021-11-27 NOTE — Telephone Encounter (Signed)
A PA has been started on CMM for Ubrelvy 100 MG Tablets. (Key: [id number]). The information has been submitted to [facility] and is under review.

## 2021-11-28 ENCOUNTER — Encounter (INDEPENDENT_AMBULATORY_CARE_PROVIDER_SITE_OTHER): Payer: Self-pay | Admitting: Family Medicine

## 2021-11-28 ENCOUNTER — Ambulatory Visit (INDEPENDENT_AMBULATORY_CARE_PROVIDER_SITE_OTHER): Payer: 59 | Admitting: Family Medicine

## 2021-11-28 VITALS — BP 106/73 | HR 95 | Temp 97.8°F | Ht 65.0 in | Wt 293.0 lb

## 2021-11-28 DIAGNOSIS — E88819 Insulin resistance, unspecified: Secondary | ICD-10-CM | POA: Insufficient documentation

## 2021-11-28 DIAGNOSIS — D508 Other iron deficiency anemias: Secondary | ICD-10-CM | POA: Diagnosis not present

## 2021-11-28 DIAGNOSIS — G932 Benign intracranial hypertension: Secondary | ICD-10-CM | POA: Diagnosis not present

## 2021-11-28 DIAGNOSIS — E669 Obesity, unspecified: Secondary | ICD-10-CM | POA: Diagnosis not present

## 2021-11-28 DIAGNOSIS — E559 Vitamin D deficiency, unspecified: Secondary | ICD-10-CM | POA: Diagnosis not present

## 2021-11-28 DIAGNOSIS — E8881 Metabolic syndrome: Secondary | ICD-10-CM

## 2021-11-28 DIAGNOSIS — Z6841 Body Mass Index (BMI) 40.0 and over, adult: Secondary | ICD-10-CM | POA: Diagnosis not present

## 2021-11-28 DIAGNOSIS — E66813 Obesity, class 3: Secondary | ICD-10-CM

## 2021-11-28 DIAGNOSIS — D649 Anemia, unspecified: Secondary | ICD-10-CM | POA: Insufficient documentation

## 2021-11-28 LAB — ALPHA-THALASSEMIA GENOTYPR

## 2021-11-28 MED ORDER — METFORMIN HCL 500 MG PO TABS: 500.0000 mg | ORAL_TABLET | Freq: Every day | ORAL | 0 refills | Status: DC

## 2021-11-29 NOTE — Telephone Encounter (Signed)
PA has been APPROVED. Coverage: 11/27/21-11/28/22

## 2021-11-30 ENCOUNTER — Ambulatory Visit (INDEPENDENT_AMBULATORY_CARE_PROVIDER_SITE_OTHER): Payer: 59 | Admitting: Nurse Practitioner

## 2021-11-30 ENCOUNTER — Encounter: Payer: Self-pay | Admitting: Nurse Practitioner

## 2021-11-30 VITALS — BP 108/72 | HR 80

## 2021-11-30 DIAGNOSIS — Z30431 Encounter for routine checking of intrauterine contraceptive device: Secondary | ICD-10-CM | POA: Diagnosis not present

## 2021-11-30 NOTE — Progress Notes (Signed)
   Acute Office Visit  Subjective:    Patient ID: Stephanie Cohen, female    DOB: 04/25/2000, 21 y.o.   MRN: [id number]   HPI 21 y.o. presents today for 4-week IUD check up. Mirena IUD inserted 10/30/2021. Light bleeding first couple of weeks but none since. No pain.    Review of Systems  Constitutional: Negative.   Genitourinary: Negative.        Objective:    Physical Exam Constitutional:      Appearance: Normal appearance.  Genitourinary:    General: Normal vulva.     Vagina: Normal.     Cervix: Normal.     Uterus: Normal.      Comments: IUD string visible ~3 cm    BP 108/72   Pulse 80   SpO2 98%  Wt Readings from Last 3 Encounters:  11/28/21 293 lb (132.9 kg)  11/21/21 (!) 301 lb 3.2 oz (136.6 kg)  11/13/21 (!) 300 lb 3.2 oz (136.2 kg)        Patient informed chaperone available to be present for breast and/or pelvic exam. Patient has requested no chaperone to be present. Patient has been advised what will be completed during breast and pelvic exam.   Assessment & Plan:   Problem List Items Addressed This Visit   None Visit Diagnoses     IUD check up    -  Primary      Plan: No concerns. IUD string visible on exam. Aware of warning signs and 8-year FDA approval. Return as needed.      Olivia Mackie DNP, 11:26 AM 11/30/2021

## 2021-12-04 MED ORDER — VITAMIN D (ERGOCALCIFEROL) 1.25 MG (50000 UNIT) PO CAPS: 50000.0000 [IU] | ORAL_CAPSULE | ORAL | 0 refills | Status: DC

## 2021-12-04 NOTE — Progress Notes (Unsigned)
Chief Complaint:   OBESITY Stephanie Cohen is here to discuss her progress with her obesity treatment plan along with follow-up of her obesity related diagnoses. Stephanie Cohen is on the Category 3 Plan and states she is following her eating plan approximately 70% of the time. Stephanie Cohen states she is going to they gym and walking 30-60 minutes 1-2 times per week.  Today's visit was #: 4 Starting weight: 304 lbs Starting date: 10/03/2021 Today's weight: 293 lbs Today's date: 11/28/2021 Total lbs lost to date: 11 lbs Total lbs lost since last in-office visit: 7 lbs  Interim History:  Cooking more at home and doing better with meal prep.  Working long hours, working nights.  Eating 2 meals and 1-2 snack per day.  Diamox makes her not hungry.  Denies cravings.  Door dashes 5 times per week.   Subjective:   1. Insulin resistance Doing well on Metformin 500 mg once a day with food.  Denies adverse side effects.   2. Vitamin D deficiency On Vitamin D 50,000 IU weekly, complains of fatigue.   3. Other iron deficiency anemia Seen by hematology, on ferrous sulfate 325 mg daily, has been tolerating well.  S/P IUD placement for DUB.  4. Intracranial hypertension Actively working on weight loss and is on Diamox per Neurology.  Is down 11 lbs in one month thus far.   Assessment/Plan:   1. Insulin resistance Recheck labs in 1-2 months.  Refill - metFORMIN (GLUCOPHAGE) 500 MG tablet; Take 1 tablet (500 mg total) by mouth daily with breakfast.  Dispense: 30 tablet; Refill: 0  2. Vitamin D deficiency Recheck  level in 2 months.   Refill- Vitamin D, Ergocalciferol, (DRISDOL) 1.25 MG (50000 UNIT) CAPS capsule; Take 1 capsule (50,000 Units total) by mouth every 7 (seven) days.  Dispense: 5 capsule; Refill: 0  3. Other iron deficiency anemia Continue prescription iron daily per hematology.    4. Intracranial hypertension Continue active plan for weight reduction.   5. Obesity, current BMI  48.8 Change to journaling.  Eating out handout given.   Stephanie Cohen is currently in the action stage of change. As such, her goal is to continue with weight loss efforts. She has agreed to keeping a food journal and adhering to recommended goals of 32992 calories and 120 protein.   Exercise goals:  Weight training at gym 2 times per week.    Behavioral modification strategies: increasing lean protein intake, increasing water intake, decreasing eating out, no skipping meals, meal planning and cooking strategies, better snacking choices, planning for success, and decreasing junk food.  Stephanie Cohen has agreed to follow-up with our clinic in 3-4 weeks. She was informed of the importance of frequent follow-up visits to maximize her success with intensive lifestyle modifications for her multiple health conditions.   Objective:   Blood pressure 106/73, pulse 95, temperature 97.8 F (36.6 C), height 5\' 5"  (1.651 m), weight 293 lb (132.9 kg), SpO2 98 %. Body mass index is 48.76 kg/m.  General: Cooperative, alert, well developed, in no acute distress. HEENT: Conjunctivae and lids unremarkable. Cardiovascular: Regular rhythm.  Lungs: Normal work of breathing. Neurologic: No focal deficits.   Lab Results  Component Value Date   CREATININE 0.70 11/13/2021   BUN 14 11/13/2021   NA 138 11/13/2021   K 4.2 11/13/2021   CL 116 (H) 11/13/2021   CO2 17 (L) 11/13/2021   Lab Results  Component Value Date   ALT 11 11/13/2021   AST 16 11/13/2021  ALKPHOS 51 11/13/2021   BILITOT 0.3 11/13/2021   Lab Results  Component Value Date   HGBA1C 5.4 10/03/2021   Lab Results  Component Value Date   INSULIN 17.7 10/03/2021   Lab Results  Component Value Date   TSH 3.120 10/03/2021   Lab Results  Component Value Date   CHOL 135 10/03/2021   HDL 43 10/03/2021   LDLCALC 78 10/03/2021   TRIG 68 10/03/2021   Lab Results  Component Value Date   VD25OH 13.2 (L) 10/03/2021   Lab Results  Component  Value Date   WBC 10.2 11/13/2021   HGB 10.9 (L) 11/13/2021   HCT 33.8 (L) 11/13/2021   MCV 58.8 (L) 11/13/2021   PLT 392 11/13/2021   Lab Results  Component Value Date   IRON 14 (L) 11/13/2021   TIBC 385 11/13/2021   FERRITIN 11 11/13/2021    Attestation Statements:   Reviewed by clinician on day of visit: allergies, medications, problem list, medical history, surgical history, family history, social history, and previous encounter notes.  I, Davy Pique, am acting as Location manager for Loyal Gambler, DO.  I have reviewed the above documentation for accuracy and completeness, and I agree with the above. -  ***

## 2021-12-05 ENCOUNTER — Other Ambulatory Visit: Payer: Self-pay | Admitting: Hematology and Oncology

## 2021-12-05 ENCOUNTER — Telehealth: Payer: Self-pay | Admitting: *Deleted

## 2021-12-05 NOTE — Telephone Encounter (Signed)
1) Her labs do confirm Iron deficiency. 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 There is no specific treatment for this condition 3) We will see her back in Nov 2023. If she cannot tolerate the PO iron or it is ineffective we can try IV iron. Please encourage her to call if the PO iron is difficult to tolerate.   Notified of message above. States that she is tolerating the oral iron without any problems. Will see Dr Lorenso Courier in November

## 2021-12-26 ENCOUNTER — Ambulatory Visit (INDEPENDENT_AMBULATORY_CARE_PROVIDER_SITE_OTHER): Payer: 59 | Admitting: Family Medicine

## 2021-12-26 VITALS — BP 111/75 | HR 75 | Temp 98.5°F | Ht 65.0 in | Wt 291.0 lb

## 2021-12-26 DIAGNOSIS — E88819 Insulin resistance, unspecified: Secondary | ICD-10-CM | POA: Diagnosis not present

## 2021-12-26 DIAGNOSIS — E559 Vitamin D deficiency, unspecified: Secondary | ICD-10-CM

## 2021-12-26 DIAGNOSIS — E669 Obesity, unspecified: Secondary | ICD-10-CM

## 2021-12-26 DIAGNOSIS — D508 Other iron deficiency anemias: Secondary | ICD-10-CM

## 2021-12-26 DIAGNOSIS — Z6841 Body Mass Index (BMI) 40.0 and over, adult: Secondary | ICD-10-CM | POA: Diagnosis not present

## 2021-12-26 DIAGNOSIS — G932 Benign intracranial hypertension: Secondary | ICD-10-CM

## 2021-12-26 MED ORDER — VITAMIN D (ERGOCALCIFEROL) 1.25 MG (50000 UNIT) PO CAPS: 50000.0000 [IU] | ORAL_CAPSULE | ORAL | 0 refills | Status: DC

## 2021-12-26 MED ORDER — METFORMIN HCL 500 MG PO TABS: 500.0000 mg | ORAL_TABLET | Freq: Every day | ORAL | 0 refills | Status: DC

## 2021-12-27 NOTE — Progress Notes (Signed)
Chief Complaint:   OBESITY Stephanie Cohen is here to discuss her progress with her obesity treatment plan along with follow-up of her obesity related diagnoses. Stephanie Cohen is on keeping a food journal and adhering to recommended goals of 1600 calories and 120 gms protein daily and states she is following her eating plan approximately 60% of the time. Stephanie Cohen states she is doing weights and cardio for 60 to 120 minutes 2-3 times per week.  Today's visit was #: 5 Starting weight: 304 lbs Starting date: 10/03/2021 Today's weight: 291 lbs Today's date: 12/26/21 Total lbs lost to date: 13 Total lbs lost since last in-office visit: -2  Interim History: Off meal plan.  Eating 3 eggs, 2 [place] sausages, 2 protein pancakes ([place] cakes) plus sugar-free syrup.  Eats breakfast about 11 AM.  Still struggling with eating out.  Has little appetite with Diamox.  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  Inconsistent with journaling.  Subjective:   1. Insulin resistance Started metformin 500 mg daily. Has some loose stools.  2. Other iron deficiency anemia On ferrous sulfate 325 mg daily with OJ per hematology. Some constipating side effects.  3. Intracranial hypertension Down 13 pounds in 3 months.  Headaches improving. On Diamox daily with loss of appetite.  4.  Vitamin D deficiency On prescription vitamin D 50,000 IU weekly. Last vitamin D level 13.2 on 10/03/2021.    Assessment/Plan:   1. Insulin resistance Reminded patient to take metformin with food. Refilled: - metFORMIN (GLUCOPHAGE) 500 MG tablet; Take 1 tablet (500 mg total) by mouth daily with breakfast.  Dispense: 30 tablet; Refill: 0  2. Other iron deficiency anemia Change OJ to Trop 50 OJ 4 ounces with oral iron (to reduce sugar intake).  3. Intracranial hypertension Continue to work on weight loss.  4.  Vitamin D deficiency Refilled: - Vitamin D, Ergocalciferol, (DRISDOL) 1.25 MG (50000 UNIT) CAPS capsule; Take 1  capsule (50,000 Units total) by mouth every 7 (seven) days.  Dispense: 5 capsule; Refill: 0  5. Obesity,current BMI 48.5 Stephanie Cohen is currently in the action stage of change. As such, her goal is to continue with weight loss efforts. She has agreed to keeping a food journal and adhering to recommended goals of 1600 calories and 120 gms protein daily.  Exercise goals: as is  Behavioral modification strategies: increasing lean protein intake, decreasing simple carbohydrates, increasing vegetables, increasing water intake, decreasing eating out, no skipping meals, meal planning and cooking strategies, keeping healthy foods in the home, better snacking choices, and decreasing junk food.  Stephanie Cohen has agreed to follow-up with our clinic in 3-4 weeks. She was informed of the importance of frequent follow-up visits to maximize her success with intensive lifestyle modifications for her multiple health conditions.   Objective:   Blood pressure 111/75, pulse 75, temperature 98.5 F (36.9 C), height 5\' 5"  (1.651 m), weight 291 lb (132 kg), SpO2 99 %. Body mass index is 48.42 kg/m.  General: Cooperative, alert, well developed, in no acute distress. HEENT: Conjunctivae and lids unremarkable. Cardiovascular: Regular rhythm.  Lungs: Normal work of breathing. Neurologic: No focal deficits.   Lab Results  Component Value Date   CREATININE 0.70 11/13/2021   BUN 14 11/13/2021   NA 138 11/13/2021   K 4.2 11/13/2021   CL 116 (H) 11/13/2021   CO2 17 (L) 11/13/2021   Lab Results  Component Value Date   ALT 11 11/13/2021   AST 16 11/13/2021   ALKPHOS 51  11/13/2021   BILITOT 0.3 11/13/2021   Lab Results  Component Value Date   HGBA1C 5.4 10/03/2021   Lab Results  Component Value Date   INSULIN 17.7 10/03/2021   Lab Results  Component Value Date   TSH 3.120 10/03/2021   Lab Results  Component Value Date   CHOL 135 10/03/2021   HDL 43 10/03/2021   LDLCALC 78 10/03/2021   TRIG 68 10/03/2021    Lab Results  Component Value Date   VD25OH 13.2 (L) 10/03/2021   Lab Results  Component Value Date   WBC 10.2 11/13/2021   HGB 10.9 (L) 11/13/2021   HCT 33.8 (L) 11/13/2021   MCV 58.8 (L) 11/13/2021   PLT 392 11/13/2021   Lab Results  Component Value Date   IRON 14 (L) 11/13/2021   TIBC 385 11/13/2021   FERRITIN 11 11/13/2021     Attestation Statements:   Reviewed by clinician on day of visit: allergies, medications, problem list, medical history, surgical history, family history, social history, and previous encounter notes.  I, Jesse Sans, FNP, am acting as transcriptionist for Dr. Seymour Bars.  I have reviewed the above documentation for accuracy and completeness, and I agree with the above. Glennis Brink, DO

## 2022-01-24 ENCOUNTER — Telehealth: Payer: Self-pay | Admitting: Neurology

## 2022-01-24 DIAGNOSIS — G43009 Migraine without aura, not intractable, without status migrainosus: Secondary | ICD-10-CM

## 2022-01-24 MED ORDER — ACETAZOLAMIDE 250 MG PO TABS: 750.0000 mg | ORAL_TABLET | Freq: Two times a day (BID) | ORAL | 3 refills | Status: DC

## 2022-01-24 MED ORDER — UBRELVY 100 MG PO TABS: 100.0000 mg | ORAL_TABLET | ORAL | 11 refills | Status: AC | PRN

## 2022-01-24 NOTE — Telephone Encounter (Signed)
Pt states her insurance will only cover her for the medications(Ubrogepant (UBRELVY) 100 MG TABS and acetaZOLAMIDE (DIAMOX) 250 MG tablet) to be sent to her thru [facility] [phone]   And not [facility] , please call pt to confirm this has been switched

## 2022-01-29 ENCOUNTER — Ambulatory Visit (INDEPENDENT_AMBULATORY_CARE_PROVIDER_SITE_OTHER): Payer: 59 | Admitting: Family Medicine

## 2022-01-29 ENCOUNTER — Encounter: Payer: Self-pay | Admitting: Hematology and Oncology

## 2022-01-29 ENCOUNTER — Encounter (INDEPENDENT_AMBULATORY_CARE_PROVIDER_SITE_OTHER): Payer: Self-pay | Admitting: Family Medicine

## 2022-01-29 VITALS — BP 102/71 | HR 63 | Temp 97.7°F | Ht 65.0 in | Wt 290.0 lb

## 2022-01-29 DIAGNOSIS — E669 Obesity, unspecified: Secondary | ICD-10-CM | POA: Diagnosis not present

## 2022-01-29 DIAGNOSIS — D508 Other iron deficiency anemias: Secondary | ICD-10-CM | POA: Diagnosis not present

## 2022-01-29 DIAGNOSIS — E88819 Insulin resistance, unspecified: Secondary | ICD-10-CM | POA: Diagnosis not present

## 2022-01-29 DIAGNOSIS — Z6841 Body Mass Index (BMI) 40.0 and over, adult: Secondary | ICD-10-CM | POA: Diagnosis not present

## 2022-01-29 DIAGNOSIS — E559 Vitamin D deficiency, unspecified: Secondary | ICD-10-CM | POA: Diagnosis not present

## 2022-01-29 MED ORDER — METFORMIN HCL 500 MG PO TABS: 500.0000 mg | ORAL_TABLET | Freq: Every day | ORAL | 0 refills | Status: DC

## 2022-01-29 MED ORDER — VITAMIN D (ERGOCALCIFEROL) 1.25 MG (50000 UNIT) PO CAPS: 50000.0000 [IU] | ORAL_CAPSULE | ORAL | 0 refills | Status: DC

## 2022-01-31 LAB — ANEMIA PANEL
Ferritin: 28 ng/mL (ref 15–150)
Folate, Hemolysate: 330 ng/mL
Folate, RBC: 786 ng/mL (ref >498)
Hematocrit: 42 % (ref 34.0–46.6)
Iron Saturation: 10 % — ABNORMAL LOW (ref 15–55)
Iron: 31 ug/dL (ref 27–159)
Retic Ct Pct: 1.2 % (ref 0.6–2.6)
Total Iron Binding Capacity: 323 ug/dL (ref 250–450)
UIBC: 292 ug/dL (ref 131–425)
Vitamin B-12: 265 pg/mL (ref 232–1245)

## 2022-01-31 LAB — COMPREHENSIVE METABOLIC PANEL
ALT: 11 IU/L (ref 0–32)
AST: 17 IU/L (ref 0–40)
Albumin/Globulin Ratio: 1.4 (ref 1.2–2.2)
Albumin: 4.2 g/dL (ref 4.0–5.0)
Alkaline Phosphatase: 56 IU/L (ref 44–121)
BUN/Creatinine Ratio: 19 (ref 9–23)
BUN: 15 mg/dL (ref 6–20)
Bilirubin Total: 0.3 mg/dL (ref 0.0–1.2)
CO2: 15 mmol/L — ABNORMAL LOW (ref 20–29)
Calcium: 8.6 mg/dL — ABNORMAL LOW (ref 8.7–10.2)
Chloride: 110 mmol/L — ABNORMAL HIGH (ref 96–106)
Creatinine, Ser: 0.78 mg/dL (ref 0.57–1.00)
Globulin, Total: 2.9 g/dL (ref 1.5–4.5)
Glucose: 76 mg/dL (ref 70–99)
Potassium: 3.9 mmol/L (ref 3.5–5.2)
Sodium: 137 mmol/L (ref 134–144)
Total Protein: 7.1 g/dL (ref 6.0–8.5)
eGFR: 109 mL/min/{1.73_m2} (ref >59)

## 2022-01-31 LAB — CBC
Hemoglobin: 12.1 g/dL (ref 11.1–15.9)
MCH: 18.4 pg — ABNORMAL LOW (ref 26.6–33.0)
MCHC: 28.8 g/dL — ABNORMAL LOW (ref 31.5–35.7)
MCV: 64 fL — ABNORMAL LOW (ref 79–97)
Platelets: 379 10*3/uL (ref 150–450)
RBC: 6.58 x10E6/uL — ABNORMAL HIGH (ref 3.77–5.28)
RDW: 21.6 % — ABNORMAL HIGH (ref 11.7–15.4)
WBC: 9.8 10*3/uL (ref 3.4–10.8)

## 2022-01-31 LAB — HEMOGLOBIN A1C
Est. average glucose Bld gHb Est-mCnc: 114 mg/dL
Hgb A1c MFr Bld: 5.6 % (ref 4.8–5.6)

## 2022-01-31 LAB — VITAMIN D 25 HYDROXY (VIT D DEFICIENCY, FRACTURES): Vit D, 25-Hydroxy: 25.2 ng/mL — ABNORMAL LOW (ref 30.0–100.0)

## 2022-01-31 LAB — INSULIN, RANDOM: INSULIN: 17.2 u[IU]/mL (ref 2.6–24.9)

## 2022-02-04 ENCOUNTER — Encounter: Payer: Self-pay | Admitting: Neurology

## 2022-02-05 NOTE — Telephone Encounter (Signed)
LVM asking pt to call back to reschedule appointment with Dr. Lucia Gaskins

## 2022-02-11 ENCOUNTER — Other Ambulatory Visit: Payer: Self-pay | Admitting: Hematology and Oncology

## 2022-02-11 DIAGNOSIS — D500 Iron deficiency anemia secondary to blood loss (chronic): Secondary | ICD-10-CM

## 2022-02-12 ENCOUNTER — Other Ambulatory Visit: Payer: Self-pay

## 2022-02-12 ENCOUNTER — Inpatient Hospital Stay (HOSPITAL_BASED_OUTPATIENT_CLINIC_OR_DEPARTMENT_OTHER): Payer: 59 | Admitting: Hematology and Oncology

## 2022-02-12 ENCOUNTER — Inpatient Hospital Stay: Payer: 59 | Attending: Hematology and Oncology

## 2022-02-12 VITALS — BP 118/60 | HR 72 | Temp 98.4°F | Resp 14 | Wt 290.1 lb

## 2022-02-12 DIAGNOSIS — I10 Essential (primary) hypertension: Secondary | ICD-10-CM | POA: Diagnosis not present

## 2022-02-12 DIAGNOSIS — D500 Iron deficiency anemia secondary to blood loss (chronic): Secondary | ICD-10-CM | POA: Diagnosis not present

## 2022-02-12 DIAGNOSIS — D582 Other hemoglobinopathies: Secondary | ICD-10-CM | POA: Diagnosis not present

## 2022-02-12 DIAGNOSIS — N920 Excessive and frequent menstruation with regular cycle: Secondary | ICD-10-CM | POA: Diagnosis not present

## 2022-02-12 DIAGNOSIS — K5900 Constipation, unspecified: Secondary | ICD-10-CM | POA: Diagnosis not present

## 2022-02-12 LAB — CMP (CANCER CENTER ONLY)
ALT: 10 U/L (ref 0–44)
AST: 13 U/L — ABNORMAL LOW (ref 15–41)
Albumin: 4.3 g/dL (ref 3.5–5.0)
Alkaline Phosphatase: 50 U/L (ref 38–126)
Anion gap: 7 (ref 5–15)
BUN: 13 mg/dL (ref 6–20)
CO2: 18 mmol/L — ABNORMAL LOW (ref 22–32)
Calcium: 9.3 mg/dL (ref 8.9–10.3)
Chloride: 113 mmol/L — ABNORMAL HIGH (ref 98–111)
Creatinine: 0.9 mg/dL (ref 0.44–1.00)
GFR, Estimated: >60 mL/min (ref >60)
Glucose, Bld: 97 mg/dL (ref 70–99)
Potassium: 3.6 mmol/L (ref 3.5–5.1)
Sodium: 138 mmol/L (ref 135–145)
Total Bilirubin: 0.4 mg/dL (ref 0.3–1.2)
Total Protein: 7.9 g/dL (ref 6.5–8.1)

## 2022-02-12 LAB — IRON AND IRON BINDING CAPACITY (CC-WL,HP ONLY)
Iron: 96 ug/dL (ref 28–170)
Saturation Ratios: 27 % (ref 10.4–31.8)
TIBC: 351 ug/dL (ref 250–450)
UIBC: 255 ug/dL (ref 148–442)

## 2022-02-12 LAB — CBC WITH DIFFERENTIAL (CANCER CENTER ONLY)
Abs Immature Granulocytes: 0.02 10*3/uL (ref 0.00–0.07)
Basophils Absolute: 0.1 10*3/uL (ref 0.0–0.1)
Basophils Relative: 1 %
Eosinophils Absolute: 0.1 10*3/uL (ref 0.0–0.5)
Eosinophils Relative: 1 %
HCT: 40.3 % (ref 36.0–46.0)
Hemoglobin: 12.9 g/dL (ref 12.0–15.0)
Immature Granulocytes: 0 %
Lymphocytes Relative: 33 %
Lymphs Abs: 3.6 10*3/uL (ref 0.7–4.0)
MCH: 19.1 pg — ABNORMAL LOW (ref 26.0–34.0)
MCHC: 32 g/dL (ref 30.0–36.0)
MCV: 59.7 fL — ABNORMAL LOW (ref 80.0–100.0)
Monocytes Absolute: 0.7 10*3/uL (ref 0.1–1.0)
Monocytes Relative: 6 %
Neutro Abs: 6.3 10*3/uL (ref 1.7–7.7)
Neutrophils Relative %: 59 %
Platelet Count: 351 10*3/uL (ref 150–400)
RBC: 6.75 MIL/uL — ABNORMAL HIGH (ref 3.87–5.11)
RDW: 21 % — ABNORMAL HIGH (ref 11.5–15.5)
WBC Count: 10.8 10*3/uL — ABNORMAL HIGH (ref 4.0–10.5)
nRBC: 0 % (ref 0.0–0.2)

## 2022-02-12 LAB — RETIC PANEL
Immature Retic Fract: 16.7 % — ABNORMAL HIGH (ref 2.3–15.9)
RBC.: 6.79 MIL/uL — ABNORMAL HIGH (ref 3.87–5.11)
Retic Count, Absolute: 72 10*3/uL (ref 19.0–186.0)
Retic Ct Pct: 1.1 % (ref 0.4–3.1)
Reticulocyte Hemoglobin: 23.5 pg — ABNORMAL LOW (ref >27.9)

## 2022-02-12 NOTE — Progress Notes (Signed)
[facility] [phone]   [phone]  PROGRESS NOTE  Patient Care Team: Patient, No Pcp Per as PCP - General (General Practice)  Hematological/Oncological History # Iron Deficiency Anemia 2/2 to GYN Bleeding 08/09/2021: WBC 12.6, Hgb 11.9, MCV 62, Plt 358 10/03/2021: Ferritin 13, Iron sat 11% 11/13/2021: establish care with Dr. Leonides Schanz   Interval History:  Stephanie Cohen 21 y.o. female with medical history significant for iron deficiency anemia secondary to GYN bleeding who presents for a follow up visit. The patient's last visit was on 11/13/2021 at which time she established care. In the interim since the last visit her hemoglobin levels have normalized on p.o. iron therapy, though she has run out of the medication.  On exam today Stephanie Cohen reports that she has been "okay" here since her last visit.  She reports that she does have energy though she tends to be tired.  She reports that she takes naps often but that is because she is working night shifts.  She reports that she tries to rest but is physically active the whole night and sleeps poorly during the day.  She reports that the iron pills do cause a little bit of stomach upset and she has a tendency to feel cold.  She reports that she is having some slowed bowel movements as well with some occasional constipation.  She reports that she is doing her best to try to eat more healthy including iron rich foods.  She reports that she only had 1 menstrual cycle in the interim since her last visit.  It was quite light.  She is not interested in a refill of her iron pills.  She otherwise denies any fevers, chills, sweats, nausea, vomiting or diarrhea.  Full 10 point ROS was otherwise negative.  MEDICAL HISTORY:  Past Medical History:  Diagnosis Date   ADHD    Asthma    Back pain    IIH (idiopathic intracranial hypertension)    Joint pain    PCOS (polycystic ovarian syndrome)     SURGICAL  HISTORY: Past Surgical History:  Procedure Laterality Date   NO PAST SURGERIES      SOCIAL HISTORY: Social History   Socioeconomic History   Marital status: Single    Spouse name: Not on file   Number of children: Not on file   Years of education: Not on file   Highest education level: Not on file  Occupational History   Occupation: Consulting civil engineer, CNA, [employer]  Tobacco Use   Smoking status: Never   Smokeless tobacco: Never  Vaping Use   Vaping Use: Never used  Substance and Sexual Activity   Alcohol use: Not Currently    Comment: occasional/ social   Drug use: Never   Sexual activity: Not Currently    Partners: Male, Female    Birth control/protection: I.U.D.    Comment: Mirena insertion on 10/30/2021  Other Topics Concern   Not on file  Social History Narrative   Caffiene occasional Rare.     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  MEDICATIONS:  Current Outpatient Medications  Medication Sig Dispense Refill   acetaZOLAMIDE (DIAMOX) 250 MG tablet Take 3 tablets (750 mg total) by mouth 2 (two) times daily. 540 tablet 3   cetirizine (ZYRTEC) 10 MG tablet Take 10 mg by mouth 2 (two) times daily.     ferrous sulfate 325 (65 FE) MG tablet Take 1 tablet (325 mg total) by mouth daily with breakfast. 30 tablet 3   metFORMIN (GLUCOPHAGE) 500 MG tablet Take 1 tablet (500 mg total) by mouth daily with breakfast. 30 tablet 0    Ubrogepant (UBRELVY) 100 MG TABS Take 100 mg by mouth every 2 (two) hours as needed. Maximum 200mg  a day. 16 tablet 11   Vitamin D, Ergocalciferol, (DRISDOL) 1.25 MG (50000 UNIT) CAPS capsule Take 1 capsule (50,000 Units total) by mouth every 7 (seven) days. 5 capsule 0   Current Facility-Administered Medications  Medication Dose Route Frequency Provider Last Rate Last Admin   levonorgestrel (MIRENA) 20 MCG/DAY IUD 1 each  1 each Intrauterine Once A, NP        REVIEW OF SYSTEMS:   Constitutional: ( - ) fevers, ( - )  chills , ( - ) night sweats Eyes: ( - ) blurriness of vision, ( - ) double vision, ( - ) watery eyes Ears, nose, mouth, throat, and face: ( - ) mucositis, ( - ) sore throat Respiratory: ( - ) cough, ( - ) dyspnea, ( - ) wheezes Cardiovascular: ( - ) palpitation, ( - ) chest discomfort, ( - ) lower extremity swelling Gastrointestinal:  ( - ) nausea, ( - ) heartburn, ( - ) change in bowel habits Skin: ( - ) abnormal skin rashes Lymphatics: ( - ) new lymphadenopathy, ( - ) easy bruising Neurological: ( - ) numbness, ( - ) tingling, ( - ) new weaknesses Behavioral/Psych: ( - ) mood change, ( - ) new changes  All other systems were reviewed with the patient and are negative.  PHYSICAL EXAMINATION:  Vitals:   02/12/22 1448  BP: 118/60  Pulse: 72  Resp: 14  Temp: 98.4 F (36.9 C)  SpO2: 99%   Filed Weights   02/12/22 1448  Weight: 290 lb 1.6 oz (131.6 kg)    GENERAL: Well-appearing young African-American female, alert, no distress and comfortable SKIN: skin color, texture, turgor are normal, no rashes or significant lesions EYES: conjunctiva are pink and non-injected, sclera clear LUNGS: clear to auscultation and percussion with normal breathing effort HEART: regular rate & rhythm and no murmurs and no lower extremity edema Musculoskeletal: no cyanosis of digits and no clubbing  PSYCH: alert & oriented x 3, fluent speech NEURO: no focal motor/sensory  deficits  LABORATORY DATA:  I have reviewed the data as listed    Latest Ref Rng & Units 02/12/2022    2:31 PM 01/29/2022   12:01 PM 11/13/2021    2:34 PM  CBC  WBC 4.0 - 10.5 K/uL 10.8  9.8  10.2   Hemoglobin 12.0 - 15.0 g/dL 11/15/2021  80.9  98.3   Hematocrit 36.0 - 46.0 % 40.3  42.0  33.8   Platelets 150 - 400 K/uL 351  379  392        Latest Ref Rng & Units 01/29/2022  12:01 PM 11/13/2021    2:34 PM 08/10/2021    3:31 AM  CMP  Glucose 70 - 99 mg/dL 76  95  87   BUN 6 - 20 mg/dL 15  14  11    Creatinine 0.57 - 1.00 mg/dL  8.89  1.69   Sodium 134 - 144 mmol/L 137  138  134   Potassium 3.5 - 5.2 mmol/L 3.9  4.2  3.7   Chloride 96 - 106 mmol/L 110  116  110   CO2 20 - 29 mmol/L 15  17  20    Calcium 8.7 - 10.2 mg/dL 8.6  8.9  8.8   Total Protein 6.0 - 8.5 g/dL 7.1  7.4    Total Bilirubin 0.0 - 1.2 mg/dL 0.3  0.3    Alkaline Phos 44 - 121 IU/L 56  51    AST 0 - 40 IU/L 17  16    ALT 0 - 32 IU/L 11  11       RADIOGRAPHIC STUDIES: No results found.  ASSESSMENT & PLAN Stephanie Cohen 21 y.o. female with medical history significant for iron deficiency anemia secondary to GYN bleeding who presents for a follow up visit.   After review of the labs, review of the records, and discussion with the patient the patients findings are most consistent with iron deficiency anemia secondary to heavy menstrual bleeding.  Additionally the patient has marked microcytosis which may be due to an underlying thalassemia.  As such I would recommend we do full thalassemia testing with hemoglobin fractionation cascade as well as alpha globulin gene testing.  The patient voiced understanding of the plan moving forward.   # Iron Deficiency Anemia 2/2 to GYN Bleeding -- Findings are consistent with iron deficiency anemia secondary to patient's menorrhagia --Encouraged her to follow-up with OB/GYN for better control of her menstrual cycles.  IUD placed on 10/30/2021. -- Today we will repeat iron  panel and ferritin as well as reticulocytes, CBC, and CMP --start ferrous sulfate 325 mg daily with a source of vitamin C if iron levels are found to be low.  --If iron levels remain consistently low despite PO iron therapy will plan to proceed with IV iron therapy in order to help bolster the patient's blood counts --Labs today show white blood cell count 10.8, hemoglobin 12.9, MCV 59.7, and platelets of 351 --Plan for return to clinic in 3 months.    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  No orders of the defined types were placed in this encounter.   All questions were answered. The patient knows to call the clinic with any problems, questions or concerns.  A total of more than 30 minutes were spent on this encounter with face-to-face time and non-face-to-face time, including preparing to see the patient, ordering tests and/or medications, counseling the patient and coordination of care as outlined above.   30, MD Department of Hematology/Oncology [facility] Phone: [phone] Pager: [phone] Email: [email or web address]  02/12/2022 3:16 PM

## 2022-02-13 ENCOUNTER — Telehealth: Payer: Self-pay | Admitting: Hematology and Oncology

## 2022-02-13 LAB — FERRITIN: Ferritin: 34 ng/mL (ref 11–307)

## 2022-02-13 NOTE — Telephone Encounter (Signed)
Per 11/27 los called and spoke to pt about appointment  

## 2022-02-13 NOTE — Progress Notes (Signed)
Chief Complaint:   OBESITY Stephanie Cohen is here to discuss her progress with her obesity treatment plan along with follow-up of her obesity related diagnoses. Stephanie Cohen is on keeping a food journal and adhering to recommended goals of 1600 calories and 120+ grams of protein and states she is following her eating plan approximately 100% of the time. Stephanie Cohen states she is working out 20 minutes 1 times per week.  Today's visit was #: 6 Starting weight: 304 lbs Starting date: 10/03/2021 Today's weight: 290 lbs Today's date: 01/29/2022 Total lbs lost to date: 14 lbs Total lbs lost since last in-office visit: 1  Interim History: Stephanie Cohen has been trying to stay consistent with meal plan but has had more time demands. Money was tight over the last month so she ate at her family's house more frequently. Eating 2 eggs+turkey sausage. Does not tend to be a big holiday eater.  Subjective:   1. Insulin resistance Stephanie Cohen is currently taking Metformin. Denies GI side effects. Her A1c was 5.4, Insulin was 17.7.  2. Vitamin D deficiency Stephanie Cohen is currently taking prescription Vit D 50,000 IU once a week. Denies any nausea, vomiting or muscle weakness. She notes fatigue.  3. Other iron deficiency anemia Stephanie Cohen's last H/H decreased. She has thalassemia. She is on Iron by mouth.  Assessment/Plan:   1. Insulin resistance We will obtain labs today. We will refill Metformin 500 mg daily for 1 month with 0 refills.  -Refill metFORMIN (GLUCOPHAGE) 500 MG tablet; Take 1 tablet (500 mg total) by mouth daily with breakfast.  Dispense: 30 tablet; Refill: 0  - Comprehensive metabolic panel - Hemoglobin A1c - Insulin, random  2. Vitamin D deficiency We will obtain labs today. We will refill Vit D 50K IU once a week for 1 month with 0 refills.  -Refill Vitamin D, Ergocalciferol, (DRISDOL) 1.25 MG (50000 UNIT) CAPS capsule; Take 1 capsule (50,000 Units total) by mouth every 7 (seven) days.  Dispense: 5  capsule; Refill: 0  - VITAMIN D 25 Hydroxy (Vit-D Deficiency, Fractures)  3. Other iron deficiency anemia We will obtain labs today.  - Anemia panel - CBC  4. Obesity,current BMI 48.4 Stephanie Cohen is currently in the action stage of change. As such, her goal is to continue with weight loss efforts. She has agreed to the Category 3 Plan and keeping a food journal and adhering to recommended goals of 1600 calories and 120+ grams of protein daily.   Stephanie Cohen is to work on [employer].  Exercise goals: All adults should avoid inactivity. Some physical activity is better than none, and adults who participate in any amount of physical activity gain some health benefits.  Behavioral modification strategies: increasing lean protein intake, meal planning and cooking strategies, keeping healthy foods in the home, and planning for success.  Stephanie Cohen has agreed to follow-up with our clinic in 4 weeks. She was informed of the importance of frequent follow-up visits to maximize her success with intensive lifestyle modifications for her multiple health conditions.   Stephanie Cohen was informed we would discuss her lab results at her next visit unless there is a critical issue that needs to be addressed sooner. Stephanie Cohen agreed to keep her next visit at the agreed upon time to discuss these results.  Objective:   Blood pressure 102/71, pulse 63, temperature 97.7 F (36.5 C), height 5\' 5"  (1.651 m), weight 290 lb (131.5 kg), last menstrual period 01/20/2022, SpO2 100 %. Body mass index is 48.26 kg/m.  General: Cooperative, alert,  well developed, in no acute distress. HEENT: Conjunctivae and lids unremarkable. Cardiovascular: Regular rhythm.  Lungs: Normal work of breathing. Neurologic: No focal deficits.   Lab Results  Component Value Date   CREATININE 0.90 02/12/2022   BUN 13 02/12/2022   NA 138 02/12/2022   K 3.6 02/12/2022   CL 113 (H) 02/12/2022   CO2 18 (L) 02/12/2022   Lab Results   Component Value Date   ALT 10 02/12/2022   AST 13 (L) 02/12/2022   ALKPHOS 50 02/12/2022   BILITOT 0.4 02/12/2022   Lab Results  Component Value Date   HGBA1C 5.6 01/29/2022   HGBA1C 5.4 10/03/2021   Lab Results  Component Value Date   INSULIN 17.2 01/29/2022   INSULIN 17.7 10/03/2021   Lab Results  Component Value Date   TSH 3.120 10/03/2021   Lab Results  Component Value Date   CHOL 135 10/03/2021   HDL 43 10/03/2021   LDLCALC 78 10/03/2021   TRIG 68 10/03/2021   Lab Results  Component Value Date   VD25OH 25.2 (L) 01/29/2022   VD25OH 13.2 (L) 10/03/2021   Lab Results  Component Value Date   WBC 10.8 (H) 02/12/2022   HGB 12.9 02/12/2022   HCT 40.3 02/12/2022   MCV 59.7 (L) 02/12/2022   PLT 351 02/12/2022   Lab Results  Component Value Date   IRON 96 02/12/2022   TIBC 351 02/12/2022   FERRITIN 34 02/12/2022   Attestation Statements:   Reviewed by clinician on day of visit: allergies, medications, problem list, medical history, surgical history, family history, social history, and previous encounter notes.  I, Fortino Sic, RMA am acting as transcriptionist for Reuben Likes, MD.  I have reviewed the above documentation for accuracy and completeness, and I agree with the above. - Reuben Likes, MD

## 2022-02-24 ENCOUNTER — Encounter: Payer: Self-pay | Admitting: Hematology and Oncology

## 2022-02-24 ENCOUNTER — Encounter: Payer: Self-pay | Admitting: Neurology

## 2022-02-26 ENCOUNTER — Other Ambulatory Visit: Payer: Self-pay | Admitting: *Deleted

## 2022-02-26 DIAGNOSIS — H4711 Papilledema associated with increased intracranial pressure: Secondary | ICD-10-CM | POA: Diagnosis not present

## 2022-02-26 MED ORDER — ACETAZOLAMIDE 250 MG PO TABS: 750.0000 mg | ORAL_TABLET | Freq: Two times a day (BID) | ORAL | 0 refills | Status: DC

## 2022-02-26 MED ORDER — FERROUS SULFATE 325 (65 FE) MG PO TABS: 325.0000 mg | ORAL_TABLET | Freq: Every day | ORAL | 3 refills | Status: DC

## 2022-02-27 ENCOUNTER — Ambulatory Visit: Payer: 59 | Admitting: Neurology

## 2022-02-27 ENCOUNTER — Other Ambulatory Visit (INDEPENDENT_AMBULATORY_CARE_PROVIDER_SITE_OTHER): Payer: Self-pay | Admitting: Family Medicine

## 2022-02-27 DIAGNOSIS — E559 Vitamin D deficiency, unspecified: Secondary | ICD-10-CM

## 2022-03-01 ENCOUNTER — Encounter (INDEPENDENT_AMBULATORY_CARE_PROVIDER_SITE_OTHER): Payer: Self-pay | Admitting: Family Medicine

## 2022-03-01 ENCOUNTER — Ambulatory Visit (INDEPENDENT_AMBULATORY_CARE_PROVIDER_SITE_OTHER): Payer: 59 | Admitting: Family Medicine

## 2022-03-01 VITALS — BP 108/74 | HR 83 | Temp 98.4°F | Ht 65.0 in | Wt 287.0 lb

## 2022-03-01 DIAGNOSIS — Z6841 Body Mass Index (BMI) 40.0 and over, adult: Secondary | ICD-10-CM | POA: Diagnosis not present

## 2022-03-01 DIAGNOSIS — E669 Obesity, unspecified: Secondary | ICD-10-CM | POA: Diagnosis not present

## 2022-03-01 DIAGNOSIS — E88819 Insulin resistance, unspecified: Secondary | ICD-10-CM | POA: Diagnosis not present

## 2022-03-01 DIAGNOSIS — E559 Vitamin D deficiency, unspecified: Secondary | ICD-10-CM | POA: Diagnosis not present

## 2022-03-01 MED ORDER — VITAMIN D (ERGOCALCIFEROL) 1.25 MG (50000 UNIT) PO CAPS: 50000.0000 [IU] | ORAL_CAPSULE | ORAL | 0 refills | Status: DC

## 2022-03-01 MED ORDER — METFORMIN HCL 500 MG PO TABS: 500.0000 mg | ORAL_TABLET | Freq: Every day | ORAL | 0 refills | Status: DC

## 2022-03-08 ENCOUNTER — Telehealth: Payer: 59 | Admitting: Neurology

## 2022-03-08 ENCOUNTER — Telehealth: Payer: Self-pay | Admitting: Neurology

## 2022-03-08 ENCOUNTER — Encounter: Payer: Self-pay | Admitting: Neurology

## 2022-03-08 DIAGNOSIS — G932 Benign intracranial hypertension: Secondary | ICD-10-CM

## 2022-03-08 MED ORDER — ACETAZOLAMIDE 250 MG PO TABS: 750.0000 mg | ORAL_TABLET | Freq: Two times a day (BID) | ORAL | 4 refills | Status: DC

## 2022-03-08 NOTE — Progress Notes (Signed)
[facility]    Provider:  Dr Jaynee Eagles Requesting Provider: No ref. provider found Primary Care Provider:  Patient, No Pcp Per   Virtual Visit via Video Note  I connected with Stephanie Cohen on 03/08/22 at  9:15 AM EST by a video enabled telemedicine application and verified that I am speaking with the correct person using two identifiers.  Location: Patient: home Provider: office   I discussed the limitations of evaluation and management by telemedicine and the availability of in person appointments. The patient expressed understanding and agreed to proceed.    Follow Up Instructions:    I discussed the assessment and treatment plan with the patient. The patient was provided an opportunity to ask questions and all were answered. The patient agreed with the plan and demonstrated an understanding of the instructions.   The patient was advised to call back or seek an in-person evaluation if the symptoms worsen or if the condition fails to improve as anticipated.  I provided 30 minutes of non-face-to-face time during this encounter.   Melvenia Beam, MD  CC:  IDIOPATHIC INTRACRANIAL HYPERTENSION   03/08/2022, patient is here for follow-up of IIH: I reviewed Dr. Zenia Resides note he just saw her in ophthalmology on February 26, 2022 for her papilledema which she said is worse OS greater than OD but "MUCH BETTER" confirmed with opening pressure of 36 an LP otherwise unrevealing MRI MRV.  His recommendations were to continue acetazolamide at present dose which was 1500 mg daily and continue weight loss, they will follow-up with him in 6 months for an extended exam including HVF and OCT.  She had severe edema which was resolved bilaterally.  Visual acuity OD was 2020 and OS was 20/30.  08/2021 she was 308 and now 288. If get to 250 I would decrease diamox. Discuss with primary care about wegovy.   Sent not to Dr. Jearld Shines at weight loss center to see if wegovy or  zepbound might help. I'd like her to come into the office in August or can be video if she sees Dr. Katy Fitch.   No headaches, no issues, no vision changes, no side effects to the medications. Appt with Dr. Katy Fitch is in June.   Patient complains of symptoms per HPI as well as the following symptoms: IIH . Pertinent negatives and positives per HPI. All others negative   11/21/2021: Patient with IDIOPATHIC INTRACRANIAL HYPERTENSION, ran out of her medications. Sge was titrated back onto 500mg  twice daily of acetazolamide. No blackening os vision, no new vision problems, no wooshing of the ears, papilledema has improved today, still has npt gone to see dr Katy Fitch she rescheduled her appointment with Dr. Katy Fitch and I encouraged her to go. She is on 750mg  bid diamox. She has other types of headaches, pulsating pounding throbbing, light and sound sensitivity, moderate, a little queasy, movement makes it worse, different than the pressure. She has tried rizatriptan, sumatriptan, OTC meds, nothing helped acutely. No other focal neurologic deficits, associated symptoms, inciting events or modifiable factors.4 migraine days a months, < 10 total headache days a month. Needs to call Dr. Katy Fitch for repeat eye exam.  Patient complains of symptoms per HPI as well as the following symptoms: migraines . Pertinent negatives and positives per HPI. All others negative    HPI:  Stephanie Cohen is a 21 y.o. female here as requested by No ref. provider found for IDIOPATHIC INTRACRANIAL HYPERTENSION. Sent to the emergency room from Dr. Katy Fitch (Ophthalmology). She was  on diamox and ran out. 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   She is feeling better. She has reduced appetite. She started having episodes of vision  loss, wooshing in the ears, pressure headache. She has improved, no complete vision loss. She is feeling better now, headaches improved, swishing reduced, no more vison changes, we discussed IDIOPATHIC INTRACRANIAL HYPERTENSION and treatment, risk of vision loss, they were very educated on the syndrome, discussed weight loss. Mother is her an provides much information. No other focal neurologic deficits, associated symptoms, inciting events or modifiable factors.   Reviewed notes, labs and imaging from outside physicians, which showed:  MRI 08/08/2021: IMPRESSION: MRI head:   No evidence of acute intracranial abnormality.   MRI of the orbits:   1. Question slight protrusion of the optic papilla bilaterally and mild prominence of the optic nerve sheaths, which is nonspecific but could potentially represent papilledema. Consider correlation with fundoscopic exam. 2. Strabismus.  CTV: IMPRESSION: No acute intracranial abnormality.   No dural venous sinus thrombosis.  Review of Systems: Patient complains of symptoms per HPI as well as the following symptoms headache impoved. Pertinent negatives and positives per HPI. All others negative.   Social History   Socioeconomic History   Marital status: Single    Spouse name: Not on file   Number of children: Not on file   Years of education: Not on file   Highest education level: Not on file  Occupational History   Occupation: Ship broker, CNA, [employer]  Tobacco Use   Smoking status: Never   Smokeless tobacco: Never  Vaping Use   Vaping Use: Never used  Substance and Sexual Activity   Alcohol use: Not Currently    Comment: occasional/ social   Drug use: Never   Sexual activity: Not Currently    Partners: Male, Female    Birth control/protection: I.U.D.    Comment: Mirena insertion on 10/30/2021  Other Topics Concern   Not on file  Social History Narrative   Caffiene occasional Rare.     Work: ET/ Quarry manager at [employer] Strain: Not on [employer] Insecurity: Not on file  Transportation Needs: Not on file  Physical Activity: Not on file  Stress: Not on file  Social Connections: Not on file  Intimate Partner Violence: Not on file    Family History  Problem Relation Age of Onset   Obesity Mother    Obesity Father    Asthma Father    Diabetes Father    Crohn's disease Maternal Grandmother    Diabetes Paternal Grandmother    Stroke Neg Hx     Past Medical History:  Diagnosis Date   ADHD    Asthma    Back pain    IIH (idiopathic intracranial hypertension)    Joint pain    PCOS (polycystic ovarian syndrome)     Patient Active Problem List   Diagnosis Date Noted   Intracranial hypertension 12/26/2021   Insulin resistance 11/28/2021   Vitamin D deficiency 11/28/2021   Absolute anemia 11/28/2021   IIH (idiopathic intracranial hypertension) 11/21/2021   Migraine without aura and without status migrainosus, not intractable 11/21/2021   Pseudotumor cerebri 08/09/2021   Idiopathic intracranial hypertension 08/08/2021   Mild intermittent asthma  without complication 08/08/2021   Class 3 severe obesity due to excess calories without serious comorbidity with body mass index (BMI) of 50.0 to 59.9 in adult [facility]) 08/08/2021   Leukocytosis 08/08/2021   History of ADHD 12/05/2020   Referred for medication therapy management 12/05/2020    Past Surgical History:  Procedure Laterality Date   NO PAST SURGERIES      Current Outpatient Medications  Medication Sig Dispense Refill   acetaZOLAMIDE (DIAMOX) 250 MG tablet Take 3 tablets (750 mg total) by mouth 2 (two) times daily. 360 tablet 4   cetirizine (ZYRTEC) 10 MG tablet Take 10 mg by mouth 2 (two) times daily.     ferrous sulfate 325 (65 FE) MG tablet Take 1 tablet (325 mg total) by mouth daily with breakfast. 30 tablet 3   metFORMIN (GLUCOPHAGE) 500 MG tablet Take 1 tablet (500 mg total) by mouth daily  with breakfast. 30 tablet 0   Ubrogepant (UBRELVY) 100 MG TABS Take 100 mg by mouth every 2 (two) hours as needed. Maximum 200mg  a day. 16 tablet 11   Vitamin D, Ergocalciferol, (DRISDOL) 1.25 MG (50000 UNIT) CAPS capsule Take 1 capsule (50,000 Units total) by mouth every 7 (seven) days. 5 capsule 0   Current Facility-Administered Medications  Medication Dose Route Frequency Provider Last Rate Last Admin   levonorgestrel (MIRENA) 20 MCG/DAY IUD 1 each  1 each Intrauterine Once A, NP        Allergies as of 03/08/2022 - Review Complete 03/01/2022  Allergen Reaction Noted   Antihistamines, loratadine-type Hives 11/13/2021    Vitals: There were no vitals taken for this visit. Last Weight:  Wt Readings from Last 1 Encounters:  03/01/22 287 lb (130.2 kg)   Last Height:   Ht Readings from Last 1 Encounters:  03/01/22 5\' 5"  (1.651 m)   Physical exam: Exam: Gen: NAD, conversant      CV: Denies palpitations or chest pain or SOB. VS: Breathing at a normal rate. Weight appears obese. Not febrile. Eyes: Conjunctivae clear without exudates or hemorrhage  Neuro: Detailed Neurologic Exam  Speech:    Speech is normal; fluent and spontaneous with normal comprehension.  Cognition:    The patient is oriented to person, place, and time;     recent and remote memory intact;     language fluent;     normal attention, concentration,     fund of knowledge Cranial Nerves:    The pupils are equal, round, and reactive to light.  Visual fields are full. Extraocular movements are intact.  The face is symmetric with normal sensation. The palate elevates in the midline. Hearing intact. Voice is normal. Shoulder shrug is normal. The tongue has normal motion without fasciculations.   Coordination:    Normal finger to nose  Gait:    Normal native gait  Motor Observation:   no involuntary movements noted. Tone:    Appears normal  Posture:    Posture is normal. normal erect     Strength:    Strength is anti-gravity and symmetric in the upper and lower limbs.      Sensation: intact to LT          Assessment/Plan:  patient with IDIOPATHIC INTRACRANIAL HYPERTENSION; opening pressure on LP was 36, MRI showed mild prominence of the optic nerve sheaths, fundoscopic exam showed grade 3 papilledema and vision loss, CTV showed no stenosis or sinus thrombosis, she was restarted on her diamox.  Symptoms and papilledema improved. She is  on 750mg  BID and doing well but having episodic migraines.  patient is here for follow-up of IIH: I reviewed Dr. Zenia Resides note he just saw her in ophthalmology on February 26, 2022 for her papilledema which she said is worse OS greater than OD but "MUCH BETTER" confirmed with opening pressure of 36 an LP otherwise unrevealing MRI MRV.  His recommendations were to continue acetazolamide at present dose which was 1500 mg daily and continue weight loss, they will follow-up with him in 6 months for an extended exam including HVF and OCT.  She had severe edema which was resolved bilaterally.  Visual acuity OD was 2020 and OS was 20/30.  08/2021 she was 308 and now 288. If get to 250lbs I would decrease diamox. Discuss with primary care about wegovy.   Sent not to Dr. Jearld Shines at weight loss center to see if wegovy or zepbound might help. I'd like her to come into the office in August or can be video if she sees Dr. Katy Fitch.   Continue diamox  [facility] she seeing Dr. Jearld Shines. Weight loss is critical, discussed weight loss and risk of permanent vision loss, she is going to the [facility]  She denies any symptoms of sleep apnea, her vision is improved, improved episodic migraine, Ubrelvy prn continue  Patient has an IUD, discussed association, but she had IDIOPATHIC INTRACRANIAL HYPERTENSION much before the IUD     For any acute change especially worsening headache or vision loss call 911 and proceed to  ED  Meds ordered this encounter  Medications   acetaZOLAMIDE (DIAMOX) 250 MG tablet    Sig: Take 3 tablets (750 mg total) by mouth 2 (two) times daily.    Dispense:  360 tablet    Refill:  4    Cc: No ref. provider found,  Patient, No Pcp Per  Sarina Ill, MD  [facility] [address] [place], [place] [address]  Phone [phone] Fax [phone]

## 2022-03-08 NOTE — Telephone Encounter (Signed)
Sent mychart msg to inform pt of follow up appointment made

## 2022-03-08 NOTE — Telephone Encounter (Signed)
Schedule patiemt for video in August

## 2022-03-08 NOTE — Patient Instructions (Signed)
Continue current medication.

## 2022-03-14 NOTE — Progress Notes (Signed)
Chief Complaint:   OBESITY Stephanie Cohen is here to discuss her progress with her obesity treatment plan along with follow-up of her obesity related diagnoses. Stephanie Cohen is on the Category 3 Plan and keeping a food journal and adhering to recommended goals of 1600 calories and 120+ grams of protein and states she is following her eating plan approximately 70% of the time. Stephanie Cohen states she is exercising 20 minutes 1-2 times per week.  Today's visit was #: 7 Starting weight: 304 lbs Starting date: 10/03/2021 Today's weight: 287 lbs Today's date: 03/01/2022 Total lbs lost to date: 17 lbs Total lbs lost since last in-office visit: 3  Interim History: Since last appointment Stephanie Cohen has seen hematology and neurology.  Saw Dr. Dione Cohen and eyes are apparently better.  Has been trying to stick with logging food.  Tried to stick with calorie amount on my fitness pal averaging around 2420 cal.  Has noticed some decrease in intake and inability to log due to already prepped food.  Subjective:   1. Vitamin D deficiency Stephanie Cohen is currently taking prescription Vit D 50,000 IU once a week. Denies any nausea, vomiting or muscle weakness. She notes fatigue.  2. Insulin resistance Stephanie Cohen is on Metformin daily. Denies GI side effects.  Assessment/Plan:   1. Vitamin D deficiency We will refill Vit D 50K IU once a week for 1 month with 0 refills.  -Refill Vitamin D, Ergocalciferol, (DRISDOL) 1.25 MG (50000 UNIT) CAPS capsule; Take 1 capsule (50,000 Units total) by mouth every 7 (seven) days.  Dispense: 5 capsule; Refill: 0  2. Insulin resistance We will refill Metformin 500 mg daily for 1 month with 0 refills.  -Refill  metFORMIN (GLUCOPHAGE) 500 MG tablet; Take 1 tablet (500 mg total) by mouth daily with breakfast.  Dispense: 30 tablet; Refill: 0  3. Obesity,current BMI 47.8 Stephanie Cohen is currently in the action stage of change. As such, her goal is to continue with weight loss efforts. She has agreed  to the Category 3 Plan and keeping a food journal and adhering to recommended goals of 1550-1700 calories and 120+ grams of protein daily.   Exercise goals: All adults should avoid inactivity. Some physical activity is better than none, and adults who participate in any amount of physical activity gain some health benefits.  Behavioral modification strategies: increasing lean protein intake, meal planning and cooking strategies, keeping healthy foods in the home, planning for success, and keeping a strict food journal.  Stephanie Cohen has agreed to follow-up with our clinic in 4 weeks. She was informed of the importance of frequent follow-up visits to maximize her success with intensive lifestyle modifications for her multiple health conditions.   Objective:   Blood pressure 108/74, pulse 83, temperature 98.4 F (36.9 C), height 5\' 5"  (1.651 m), weight 287 lb (130.2 kg), SpO2 100 %. Body mass index is 47.76 kg/m.  General: Cooperative, alert, well developed, in no acute distress. HEENT: Conjunctivae and lids unremarkable. Cardiovascular: Regular rhythm.  Lungs: Normal work of breathing. Neurologic: No focal deficits.   Lab Results  Component Value Date   CREATININE 0.90 02/12/2022   BUN 13 02/12/2022   NA 138 02/12/2022   K 3.6 02/12/2022   CL 113 (H) 02/12/2022   CO2 18 (L) 02/12/2022   Lab Results  Component Value Date   ALT 10 02/12/2022   AST 13 (L) 02/12/2022   ALKPHOS 50 02/12/2022   BILITOT 0.4 02/12/2022   Lab Results  Component Value Date   HGBA1C  5.6 01/29/2022   HGBA1C 5.4 10/03/2021   Lab Results  Component Value Date   INSULIN 17.2 01/29/2022   INSULIN 17.7 10/03/2021   Lab Results  Component Value Date   TSH 3.120 10/03/2021   Lab Results  Component Value Date   CHOL 135 10/03/2021   HDL 43 10/03/2021   LDLCALC 78 10/03/2021   TRIG 68 10/03/2021   Lab Results  Component Value Date   VD25OH 25.2 (L) 01/29/2022   VD25OH 13.2 (L) 10/03/2021   Lab  Results  Component Value Date   WBC 10.8 (H) 02/12/2022   HGB 12.9 02/12/2022   HCT 40.3 02/12/2022   MCV 59.7 (L) 02/12/2022   PLT 351 02/12/2022   Lab Results  Component Value Date   IRON 96 02/12/2022   TIBC 351 02/12/2022   FERRITIN 34 02/12/2022   Attestation Statements:   Reviewed by clinician on day of visit: allergies, medications, problem list, medical history, surgical history, family history, social history, and previous encounter notes.  I, Fortino Sic, RMA am acting as transcriptionist for Reuben Likes, MD.  I have reviewed the above documentation for accuracy and completeness, and I agree with the above. - Reuben Likes, MD

## 2022-03-20 ENCOUNTER — Other Ambulatory Visit (INDEPENDENT_AMBULATORY_CARE_PROVIDER_SITE_OTHER): Payer: Self-pay | Admitting: Family Medicine

## 2022-03-20 DIAGNOSIS — E559 Vitamin D deficiency, unspecified: Secondary | ICD-10-CM

## 2022-03-29 ENCOUNTER — Encounter (INDEPENDENT_AMBULATORY_CARE_PROVIDER_SITE_OTHER): Payer: Self-pay | Admitting: Family Medicine

## 2022-03-29 ENCOUNTER — Ambulatory Visit (INDEPENDENT_AMBULATORY_CARE_PROVIDER_SITE_OTHER): Payer: 59 | Admitting: Family Medicine

## 2022-03-29 VITALS — BP 100/62 | HR 73 | Temp 97.4°F | Ht 65.0 in | Wt 284.0 lb

## 2022-03-29 DIAGNOSIS — Z6841 Body Mass Index (BMI) 40.0 and over, adult: Secondary | ICD-10-CM

## 2022-03-29 DIAGNOSIS — D508 Other iron deficiency anemias: Secondary | ICD-10-CM | POA: Diagnosis not present

## 2022-03-29 DIAGNOSIS — E669 Obesity, unspecified: Secondary | ICD-10-CM

## 2022-03-29 DIAGNOSIS — E559 Vitamin D deficiency, unspecified: Secondary | ICD-10-CM | POA: Diagnosis not present

## 2022-03-29 MED ORDER — VITAMIN D (ERGOCALCIFEROL) 1.25 MG (50000 UNIT) PO CAPS: 50000.0000 [IU] | ORAL_CAPSULE | ORAL | 0 refills | Status: DC

## 2022-04-02 ENCOUNTER — Other Ambulatory Visit: Payer: Self-pay

## 2022-04-02 ENCOUNTER — Encounter: Payer: Self-pay | Admitting: Hematology and Oncology

## 2022-04-03 ENCOUNTER — Other Ambulatory Visit: Payer: Self-pay

## 2022-04-03 MED ORDER — FERROUS SULFATE 325 (65 FE) MG PO TABS: 325.0000 mg | ORAL_TABLET | Freq: Every day | ORAL | 3 refills | Status: DC

## 2022-04-09 NOTE — Progress Notes (Signed)
Chief Complaint:   OBESITY Stephanie Cohen is here to discuss her progress with her obesity treatment plan along with follow-up of her obesity related diagnoses. Stephanie Cohen is on keeping a food journal and adhering to recommended goals of 1700 calories and 120+ grams of protein and states she is following her eating plan approximately 80% of the time. Stephanie Cohen states she is exercising 0 minutes 0 times per week.  Today's visit was #: 8 Starting weight: 304 lbs Starting date: 10/03/2021 Today's weight: 284 lbs Today's date: 03/29/2022 Total lbs lost to date: 20 lbs Total lbs lost since last in-office visit: 3  Interim History: Stephanie Cohen has been trying to stay within calorie range and protein amount.  Schedule has changed recently with her going back to school.  She has been taking in more energy drinks and caffeinated beverages to help with energy and focus.   Ending up a bit short of 1700 cal and getting.  Subjective:   1. Vitamin D deficiency Denies any nausea, vomiting or muscle weakness.  She notes fatigue.  2. Other iron deficiency anemia Menstrual bleeding significant decrease with placement of IUD.  Sees hemo/oncology.  Assessment/Plan:   1. Vitamin D deficiency We will refill Vit D 50K IU once a week for 1 month with 0 refills.  -Refill Vitamin D, Ergocalciferol, (DRISDOL) 1.25 MG (50000 UNIT) CAPS capsule; Take 1 capsule (50,000 Units total) by mouth every 7 (seven) days.  Dispense: 5 capsule; Refill: 0  2. Other iron deficiency anemia Labs in 1-2 months.  3. Obesity,current BMI 47.4 Stephanie Cohen is currently in the action stage of change. As such, her goal is to continue with weight loss efforts. She has agreed to keeping a food journal and adhering to recommended goals of 1700 calories and 120+ grams of protein daily.   Discussed addition of protein with caffeine to help with attaining protein.  Exercise goals: No exercise has been prescribed at this time.  Behavioral  modification strategies: increasing lean protein intake, meal planning and cooking strategies, keeping healthy foods in the home, planning for success, and keeping a strict food journal.  Stephanie Cohen has agreed to follow-up with our clinic in 3 weeks. She was informed of the importance of frequent follow-up visits to maximize her success with intensive lifestyle modifications for her multiple health conditions.   Objective:   Blood pressure 100/62, pulse 73, temperature (!) 97.4 F (36.3 C), height 5\' 5"  (1.651 m), weight 284 lb (128.8 kg), last menstrual period 03/20/2022, SpO2 97 %. Body mass index is 47.26 kg/m.  General: Cooperative, alert, well developed, in no acute distress. HEENT: Conjunctivae and lids unremarkable. Cardiovascular: Regular rhythm.  Lungs: Normal work of breathing. Neurologic: No focal deficits.   Lab Results  Component Value Date   CREATININE 0.90 02/12/2022   BUN 13 02/12/2022   NA 138 02/12/2022   K 3.6 02/12/2022   CL 113 (H) 02/12/2022   CO2 18 (L) 02/12/2022   Lab Results  Component Value Date   ALT 10 02/12/2022   AST 13 (L) 02/12/2022   ALKPHOS 50 02/12/2022   BILITOT 0.4 02/12/2022   Lab Results  Component Value Date   HGBA1C 5.6 01/29/2022   HGBA1C 5.4 10/03/2021   Lab Results  Component Value Date   INSULIN 17.2 01/29/2022   INSULIN 17.7 10/03/2021   Lab Results  Component Value Date   TSH 3.120 10/03/2021   Lab Results  Component Value Date   CHOL 135 10/03/2021   HDL 43  10/03/2021   LDLCALC 78 10/03/2021   TRIG 68 10/03/2021   Lab Results  Component Value Date   VD25OH 25.2 (L) 01/29/2022   VD25OH 13.2 (L) 10/03/2021   Lab Results  Component Value Date   WBC 10.8 (H) 02/12/2022   HGB 12.9 02/12/2022   HCT 40.3 02/12/2022   MCV 59.7 (L) 02/12/2022   PLT 351 02/12/2022   Lab Results  Component Value Date   IRON 96 02/12/2022   TIBC 351 02/12/2022   FERRITIN 34 02/12/2022   Attestation Statements:   Reviewed by  clinician on day of visit: allergies, medications, problem list, medical history, surgical history, family history, social history, and previous encounter notes.  I, Elnora Morrison, RMA am acting as transcriptionist for Coralie Common, MD.  I have reviewed the above documentation for accuracy and completeness, and I agree with the above. - Coralie Common, MD

## 2022-04-14 ENCOUNTER — Other Ambulatory Visit (INDEPENDENT_AMBULATORY_CARE_PROVIDER_SITE_OTHER): Payer: Self-pay | Admitting: Family Medicine

## 2022-04-14 DIAGNOSIS — E88819 Insulin resistance, unspecified: Secondary | ICD-10-CM

## 2022-04-19 ENCOUNTER — Ambulatory Visit (INDEPENDENT_AMBULATORY_CARE_PROVIDER_SITE_OTHER): Payer: 59 | Admitting: Family Medicine

## 2022-04-19 ENCOUNTER — Telehealth: Payer: Self-pay | Admitting: *Deleted

## 2022-04-19 NOTE — Telephone Encounter (Signed)
Okay for Dr. Jearld Shines to monitor her CBC and iron panel. If she needs IV iron or cannot tolerate PO iron,  then she can return to our clinic.

## 2022-04-19 NOTE — Telephone Encounter (Signed)
Patient called to question if she needed to actually follow up here or not.  She states has routine labs done with [facility] with Dr Coralie Common.    Asking if she can just have labs done there and return for follow up if that providers sees anything of concern.  Routing to [employer], [place] to advise.

## 2022-04-23 ENCOUNTER — Ambulatory Visit (INDEPENDENT_AMBULATORY_CARE_PROVIDER_SITE_OTHER): Payer: 59 | Admitting: Adult Health

## 2022-04-23 ENCOUNTER — Encounter (INDEPENDENT_AMBULATORY_CARE_PROVIDER_SITE_OTHER): Payer: Self-pay | Admitting: Adult Health

## 2022-04-23 ENCOUNTER — Encounter: Payer: Self-pay | Admitting: *Deleted

## 2022-04-23 VITALS — BP 104/71 | HR 75 | Temp 98.2°F | Ht 65.0 in | Wt 281.0 lb

## 2022-04-23 DIAGNOSIS — E88819 Insulin resistance, unspecified: Secondary | ICD-10-CM | POA: Diagnosis not present

## 2022-04-23 DIAGNOSIS — E559 Vitamin D deficiency, unspecified: Secondary | ICD-10-CM | POA: Diagnosis not present

## 2022-04-23 DIAGNOSIS — Z6841 Body Mass Index (BMI) 40.0 and over, adult: Secondary | ICD-10-CM | POA: Diagnosis not present

## 2022-04-23 DIAGNOSIS — E669 Obesity, unspecified: Secondary | ICD-10-CM

## 2022-04-23 DIAGNOSIS — R6339 Other feeding difficulties: Secondary | ICD-10-CM

## 2022-04-23 MED ORDER — METFORMIN HCL 500 MG PO TABS: 500.0000 mg | ORAL_TABLET | Freq: Every day | ORAL | 0 refills | Status: DC

## 2022-04-23 MED ORDER — VITAMIN D (ERGOCALCIFEROL) 1.25 MG (50000 UNIT) PO CAPS: 50000.0000 [IU] | ORAL_CAPSULE | ORAL | 0 refills | Status: DC

## 2022-04-23 NOTE — Telephone Encounter (Signed)
Communicated response via voicemail to patient.  Advised that I would cancel her appointments here in office and if she needed anything else she could reach out to [place].    Mychart message sent as well.

## 2022-04-27 ENCOUNTER — Inpatient Hospital Stay: Payer: 59 | Admitting: Hematology and Oncology

## 2022-04-27 ENCOUNTER — Inpatient Hospital Stay: Payer: 59

## 2022-05-02 NOTE — Progress Notes (Signed)
Chief Complaint:   OBESITY Stephanie Cohen is here to discuss her progress with her obesity treatment plan along with follow-up of her obesity related diagnoses. Stephanie Cohen is on keeping a food journal with goal of 1700 calories and 120+ grams of protein daily and states she is following her eating plan approximately 80% of the time. Stephanie Cohen states she has not been exercising.  Today's visit was #: 9 Starting weight: 304 lbs Starting date: 10/03/21 Today's weight: 281 lbs Today's date: 04/23/22 Total lbs lost to date: 23 Total lbs lost since last in-office visit: -3  Interim History:  Stephanie Cohen estimates to drink 32 oz/day. She has been consciously trying to increase daily protein intake, ie: eggs, protein shakes, kodiak pancakes Average caloric intake 1100-1600   2024 Health Goals 1) Consistently exercise 2) Consistently follow the prescribed meal plan  Subjective:   1. Insulin resistance Insulin levels 01/29/22 - 17.2, 10/03/21 - 17.7. Started on metformin 10/17/2021.   No GI upset.  2. Vitamin D deficiency 01/29/2022 vitamin D level-25.2-below goal 50-70. Weekly ergocalciferol-no nausea/vomiting/muscle weakness.  3. Food aversion Ms. Stephanie Cohen states I am "a picky eater".   Certain foods can trigger a gag reflex. She denies vomiting.   Worsening symptoms last several months.   No history of eating disorder.   Assessment/Plan:   1. Insulin resistance Refill: - metFORMIN (GLUCOPHAGE) 500 MG tablet; Take 1 tablet (500 mg total) by mouth daily with breakfast.  Dispense: 30 tablet; Refill: 0  2. Vitamin D deficiency Refill: - Vitamin D, Ergocalciferol, (DRISDOL) 1.25 MG (50000 UNIT) CAPS capsule; Take 1 capsule (50,000 Units total) by mouth every 7 (seven) days.  Dispense: 5 capsule; Refill: 0  3. Food aversion Referral to Dr. Mallie Mussel. Pack food/snacks that she can eat.  4. Obesity,current BMI 46.8 Handouts - Protein content of foods, High-protein/low calorie  foods.  Stephanie Cohen. As such, her goal is to continue with weight loss efforts. She has agreed to keeping a food journal with goal of 1700 calories and 120 grams of protein daily.  Exercise goals: Increase walking.  Behavioral modification strategies: increasing lean protein intake, decreasing simple carbohydrates, meal planning and cooking strategies, keeping healthy foods in the home, planning for success, and keeping a strict food journal.  Stephanie Cohen. She was informed of the importance of frequent follow-up visits to maximize her success with intensive lifestyle modifications for her multiple health conditions.   Objective:   Blood pressure 104/71, pulse 75, temperature 98.2 F (36.8 C), height 5' 5"$  (1.651 m), weight 281 lb (127.5 kg), last menstrual period 03/20/2022, SpO2 98 %. Body mass index is 46.76 kg/m.  General: Cooperative, alert, well developed, in no acute distress. HEENT: Conjunctivae and lids unremarkable. Cardiovascular: Regular rhythm.  Lungs: Normal work of breathing. Neurologic: No focal deficits.   Lab Results  Component Value Date   CREATININE 0.90 02/12/2022   BUN 13 02/12/2022   NA 138 02/12/2022   K 3.6 02/12/2022   CL 113 (H) 02/12/2022   CO2 18 (L) 02/12/2022   Lab Results  Component Value Date   ALT 10 02/12/2022   AST 13 (L) 02/12/2022   ALKPHOS 50 02/12/2022   BILITOT 0.4 02/12/2022   Lab Results  Component Value Date   HGBA1C 5.6 01/29/2022   HGBA1C 5.4 10/03/2021   Lab Results  Component Value Date   INSULIN 17.2 01/29/2022   INSULIN 17.7  10/03/2021   Lab Results  Component Value Date   TSH 3.120 10/03/2021   Lab Results  Component Value Date   CHOL 135 10/03/2021   HDL 43 10/03/2021   LDLCALC 78 10/03/2021   TRIG 68 10/03/2021   Lab Results  Component Value Date   VD25OH 25.2 (L) 01/29/2022   VD25OH 13.2 (L) 10/03/2021   Lab  Results  Component Value Date   WBC 10.8 (H) 02/12/2022   HGB 12.9 02/12/2022   HCT 40.3 02/12/2022   MCV 59.7 (L) 02/12/2022   PLT 351 02/12/2022   Lab Results  Component Value Date   IRON 96 02/12/2022   TIBC 351 02/12/2022   FERRITIN 34 02/12/2022    Attestation Statements:   Reviewed by clinician on day of visit: allergies, medications, problem list, medical history, surgical history, family history, social history, and previous encounter notes.  I, Georgianne Fick, FNP, am acting as Location manager for Mina Marble, NP.  I have reviewed the above documentation for accuracy and completeness, and I agree with the above. -  Stephanie Loving d. Romelo Sciandra, NP-C

## 2022-05-06 ENCOUNTER — Other Ambulatory Visit: Payer: Self-pay | Admitting: Hematology and Oncology

## 2022-05-08 ENCOUNTER — Telehealth (INDEPENDENT_AMBULATORY_CARE_PROVIDER_SITE_OTHER): Payer: 59 | Admitting: Psychology

## 2022-05-09 ENCOUNTER — Inpatient Hospital Stay: Payer: 59 | Admitting: Physician Assistant

## 2022-05-09 ENCOUNTER — Inpatient Hospital Stay: Payer: 59

## 2022-05-15 ENCOUNTER — Encounter (INDEPENDENT_AMBULATORY_CARE_PROVIDER_SITE_OTHER): Payer: Self-pay | Admitting: Family Medicine

## 2022-05-15 ENCOUNTER — Ambulatory Visit (INDEPENDENT_AMBULATORY_CARE_PROVIDER_SITE_OTHER): Payer: 59 | Admitting: Adult Health

## 2022-05-15 ENCOUNTER — Ambulatory Visit (INDEPENDENT_AMBULATORY_CARE_PROVIDER_SITE_OTHER): Payer: 59 | Admitting: Family Medicine

## 2022-05-15 VITALS — BP 96/64 | HR 63 | Temp 97.8°F | Ht 65.0 in | Wt 282.0 lb

## 2022-05-15 DIAGNOSIS — E669 Obesity, unspecified: Secondary | ICD-10-CM

## 2022-05-15 DIAGNOSIS — E559 Vitamin D deficiency, unspecified: Secondary | ICD-10-CM

## 2022-05-15 DIAGNOSIS — Z6841 Body Mass Index (BMI) 40.0 and over, adult: Secondary | ICD-10-CM | POA: Diagnosis not present

## 2022-05-15 DIAGNOSIS — E88819 Insulin resistance, unspecified: Secondary | ICD-10-CM

## 2022-05-15 MED ORDER — VITAMIN D (ERGOCALCIFEROL) 1.25 MG (50000 UNIT) PO CAPS: 50000.0000 [IU] | ORAL_CAPSULE | ORAL | 0 refills | Status: DC

## 2022-05-15 MED ORDER — METFORMIN HCL 500 MG PO TABS: 500.0000 mg | ORAL_TABLET | Freq: Every day | ORAL | 0 refills | Status: DC

## 2022-05-15 NOTE — Progress Notes (Deleted)
Patient states she forgot her last appointment.  She got into the school haze.  She has been working on increasing protein amount daily.  She started eating chicken sandwiches and chunks that she got from [employer] ([employer]  She has been doing protein shakes and ham and cheese sandwiches.  Has gone over protein amount a few days in the last month.  She mentions that she feels there are new staples that are incorporated in her diet making getting her protein goal easier.  Normally doesn't feel hungry at all.  Recently went thru a period where the smell of food was making her sick to her stomach.  Has experienced some nausea with trying to eat most food and feeling like food smells bad- this is not new for patient as she voices she has been a relatively picky eater most of her life. Doesn't feel like the metformin makes a difference.

## 2022-05-29 NOTE — Progress Notes (Signed)
Chief Complaint:   OBESITY Stephanie Cohen is here to discuss her progress with her obesity treatment plan along with follow-up of her obesity related diagnoses. Stephanie Cohen is on keeping a food journal and adhering to recommended goals of 63875 calories and 120+ protein and states she is following her eating plan approximately 90% of the time. Stephanie Cohen states she is exercising in the gym 30-45 minutes 2 times per week.  Today's visit was #: 10 Starting weight: 304 LBS Starting date: 10/03/2021 Today's weight: 282 LBS Today's date: 05/15/2022 Total lbs lost to date: 22 LBS Total lbs lost since last in-office visit: +1 LB  Interim History:  Patient states she forgot her last appointment.  She got into the school haze.  She has been working on increasing protein amount daily.  She started eating chicken sandwiches and chunks that she got from [employer] ([employer]  She has been doing protein shakes and ham and cheese sandwiches.  Has gone over protein amount a few days in the last month.  She mentions that she feels there are new staples that are incorporated in her diet making getting her protein goal easier.  Normally doesn't feel hungry at all.  Recently went thru a period where the smell of food was making her sick to her stomach.  Has experienced some nausea with trying to eat most food and feeling like food smells bad- this is not new for patient as she voices she has been a relatively picky eater most of her life. Doesn't feel like the metformin makes a difference.   Subjective:   1. Vitamin D deficiency Patient is on prescription vitamin D.  Patient denies nausea, vomiting, muscle weakness, but is positive for fatigue.  2. Insulin resistance Patient is on metformin with no GI side effects consistently.  Last A1c was 5.6, insulin was 17.2.  Assessment/Plan:   1. Vitamin D deficiency Refill- Vitamin D, Ergocalciferol, (DRISDOL) 1.25 MG (50000 UNIT) CAPS capsule; Take 1 capsule (50,000 Units  total) by mouth every 7 (seven) days.  Dispense: 5 capsule; Refill: 0  2. Insulin resistance Refill- metFORMIN (GLUCOPHAGE) 500 MG tablet; Take 1 tablet (500 mg total) by mouth daily with breakfast.  Dispense: 30 tablet; Refill: 0  3. BMI 45.0-49.9, adult (Stephanie Cohen)  4. Obesity with starting BMI of 50.6 Stephanie Cohen is currently in the action stage of change. As such, her goal is to continue with weight loss efforts. She has agreed to keeping a food journal and adhering to recommended goals of 1700 calories and 120+ protein.   Exercise goals: All adults should avoid inactivity. Some physical activity is better than none, and adults who participate in any amount of physical activity gain some health benefits.  Behavioral modification strategies: increasing lean protein intake, meal planning and cooking strategies, keeping healthy foods in the home, and planning for success.  Stephanie Cohen has agreed to follow-up with our clinic in 3 weeks. She was informed of the importance of frequent follow-up visits to maximize her success with intensive lifestyle modifications for her multiple health conditions.   Objective:   Blood pressure 96/64, pulse 63, temperature 97.8 F (36.6 C), height '5\' 5"'$  (1.651 m), weight 282 lb (127.9 kg), SpO2 100 %. Body mass index is 46.93 kg/m.  General: Cooperative, alert, well developed, in no acute distress. HEENT: Conjunctivae and lids unremarkable. Cardiovascular: Regular rhythm.  Lungs: Normal work of breathing. Neurologic: No focal deficits.   Lab Results  Component Value Date   CREATININE 0.90 02/12/2022  BUN 13 02/12/2022   NA 138 02/12/2022   K 3.6 02/12/2022   CL 113 (H) 02/12/2022   CO2 18 (L) 02/12/2022   Lab Results  Component Value Date   ALT 10 02/12/2022   AST 13 (L) 02/12/2022   ALKPHOS 50 02/12/2022   BILITOT 0.4 02/12/2022   Lab Results  Component Value Date   HGBA1C 5.6 01/29/2022   HGBA1C 5.4 10/03/2021   Lab Results  Component Value Date    INSULIN 17.2 01/29/2022   INSULIN 17.7 10/03/2021   Lab Results  Component Value Date   TSH 3.120 10/03/2021   Lab Results  Component Value Date   CHOL 135 10/03/2021   HDL 43 10/03/2021   LDLCALC 78 10/03/2021   TRIG 68 10/03/2021   Lab Results  Component Value Date   VD25OH 25.2 (L) 01/29/2022   VD25OH 13.2 (L) 10/03/2021   Lab Results  Component Value Date   WBC 10.8 (H) 02/12/2022   HGB 12.9 02/12/2022   HCT 40.3 02/12/2022   MCV 59.7 (L) 02/12/2022   PLT 351 02/12/2022   Lab Results  Component Value Date   IRON 96 02/12/2022   TIBC 351 02/12/2022   FERRITIN 34 02/12/2022   Attestation Statements:   Reviewed by clinician on day of visit: allergies, medications, problem list, medical history, surgical history, family history, social history, and previous encounter notes.  I, Davy Pique, RMA, am acting as transcriptionist for Coralie Common, MD.  I have reviewed the above documentation for accuracy and completeness, and I agree with the above. - Coralie Common, MD

## 2022-06-05 ENCOUNTER — Encounter (INDEPENDENT_AMBULATORY_CARE_PROVIDER_SITE_OTHER): Payer: Self-pay | Admitting: Family Medicine

## 2022-06-05 ENCOUNTER — Ambulatory Visit (INDEPENDENT_AMBULATORY_CARE_PROVIDER_SITE_OTHER): Payer: 59 | Admitting: Family Medicine

## 2022-06-05 VITALS — BP 98/62 | HR 70 | Temp 98.3°F | Ht 65.0 in | Wt 283.0 lb

## 2022-06-05 DIAGNOSIS — E88819 Insulin resistance, unspecified: Secondary | ICD-10-CM | POA: Diagnosis not present

## 2022-06-05 DIAGNOSIS — E559 Vitamin D deficiency, unspecified: Secondary | ICD-10-CM

## 2022-06-05 DIAGNOSIS — E669 Obesity, unspecified: Secondary | ICD-10-CM

## 2022-06-05 DIAGNOSIS — Z6841 Body Mass Index (BMI) 40.0 and over, adult: Secondary | ICD-10-CM

## 2022-06-05 MED ORDER — VITAMIN D (ERGOCALCIFEROL) 1.25 MG (50000 UNIT) PO CAPS: 50000.0000 [IU] | ORAL_CAPSULE | ORAL | 0 refills | Status: DC

## 2022-06-05 MED ORDER — METFORMIN HCL 500 MG PO TABS: 500.0000 mg | ORAL_TABLET | Freq: Every day | ORAL | 0 refills | Status: DC

## 2022-06-05 NOTE — Progress Notes (Signed)
Chief Complaint:   OBESITY Stephanie Cohen is here to discuss her progress with her obesity treatment plan along with follow-up of her obesity related diagnoses. Stephanie Cohen is on keeping a food journal and adhering to recommended goals of 1700 calories and 120+ protein and states she is following her eating plan approximately 75% of the time. Stephanie Cohen states she is not exercising.   Today's visit was #: 50 Starting weight: 304 LBS Starting date: 10/03/2021 Today's weight: 283 LBS Today's date: 06/05/2022 Total lbs lost to date: 21 LBS Total lbs lost since last in-office visit: +1 LB  Interim History: Patient worked thru her spring break and studied quite a bit.  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 Has not been as consistent with her food choices and intake.  Her financial situation has changed so she has changed her groceries.  She is anticipating getting more on track with logging and hitting goals in the upcoming few weeks.  She has felt somewhat spacey and less focused on consistency.  She is very rarely going over calories but is normally averaging 1000k and only getting 47g of protein average.  She is noticing it is difficult to finish a meal she would have previously.  Subjective:   1. Insulin resistance Patient last A1c 5.6, insulin 17.2.  Patient is on metformin daily.  2. Vitamin D deficiency Patient is on prescription vitamin D. Patient last vitamin D level less than half of goal.   Assessment/Plan:   1. Insulin resistance Refill- metFORMIN (GLUCOPHAGE) 500 MG tablet; Take 1 tablet (500 mg total) by mouth daily with breakfast.  Dispense: 30 tablet; Refill: 0  2. Vitamin D deficiency Refill- Vitamin D, Ergocalciferol, (DRISDOL) 1.25 MG (50000 UNIT) CAPS capsule; Take 1 capsule (50,000 Units total) by mouth every 7 (seven) days.  Dispense: 5 capsule; Refill: 0  3. BMI 45.0-49.9, adult ([place])  4. Obesity with starting BMI of  50.6 Stephanie Cohen is currently in the action stage of change. As such, her goal is to continue with weight loss efforts. She has agreed to keeping a food journal and adhering to recommended goals of 1700 calories and 120+ protein.   Exercise goals: No exercise has been prescribed at this time.  Behavioral modification strategies: increasing lean protein intake, meal planning and cooking strategies, better snacking choices, and planning for success.  Stephanie Cohen has agreed to follow-up with our clinic in 7 weeks. She was informed of the importance of frequent follow-up visits to maximize her success with intensive lifestyle modifications for her multiple health conditions.   Objective:   Blood pressure 98/62, pulse 70, temperature 98.3 F (36.8 C), height 5\' 5"  (1.651 m), weight 283 lb (128.4 kg), last menstrual period 05/14/2022, SpO2 99 %. Body mass index is 47.09 kg/m.  General: Cooperative, alert, well developed, in no acute distress. HEENT: Conjunctivae and lids unremarkable. Cardiovascular: Regular rhythm.  Lungs: Normal work of breathing. Neurologic: No focal deficits.   Lab Results  Component Value Date   CREATININE 0.90 02/12/2022   BUN 13 02/12/2022   NA 138 02/12/2022   K 3.6 02/12/2022   CL 113 (H) 02/12/2022   CO2 18 (L) 02/12/2022   Lab Results  Component Value Date   ALT 10 02/12/2022   AST 13 (L) 02/12/2022   ALKPHOS 50 02/12/2022   BILITOT 0.4 02/12/2022   Lab Results  Component Value Date   HGBA1C 5.6 01/29/2022  HGBA1C 5.4 10/03/2021   Lab Results  Component Value Date   INSULIN 17.2 01/29/2022   INSULIN 17.7 10/03/2021   Lab Results  Component Value Date   TSH 3.120 10/03/2021   Lab Results  Component Value Date   CHOL 135 10/03/2021   HDL 43 10/03/2021   LDLCALC 78 10/03/2021   TRIG 68 10/03/2021   Lab Results  Component Value Date   VD25OH 25.2 (L) 01/29/2022   VD25OH 13.2 (L) 10/03/2021   Lab Results  Component Value Date   WBC 10.8 (H)  02/12/2022   HGB 12.9 02/12/2022   HCT 40.3 02/12/2022   MCV 59.7 (L) 02/12/2022   PLT 351 02/12/2022   Lab Results  Component Value Date   IRON 96 02/12/2022   TIBC 351 02/12/2022   FERRITIN 34 02/12/2022   Attestation Statements:   Reviewed by clinician on day of visit: allergies, medications, problem list, medical history, surgical history, family history, social history, and previous encounter notes.  I, Davy Pique, RMA, am acting as transcriptionist for Coralie Common, MD.  I have reviewed the above documentation for accuracy and completeness, and I agree with the above. - Coralie Common, MD

## 2022-06-23 ENCOUNTER — Encounter: Payer: Self-pay | Admitting: Neurology

## 2022-06-23 ENCOUNTER — Other Ambulatory Visit (INDEPENDENT_AMBULATORY_CARE_PROVIDER_SITE_OTHER): Payer: Self-pay | Admitting: Family Medicine

## 2022-06-23 DIAGNOSIS — E88819 Insulin resistance, unspecified: Secondary | ICD-10-CM

## 2022-06-23 DIAGNOSIS — E559 Vitamin D deficiency, unspecified: Secondary | ICD-10-CM

## 2022-06-25 MED ORDER — ACETAZOLAMIDE 250 MG PO TABS: 750.0000 mg | ORAL_TABLET | Freq: Two times a day (BID) | ORAL | 4 refills | Status: DC

## 2022-07-16 ENCOUNTER — Telehealth: Payer: Self-pay | Admitting: Neurology

## 2022-07-16 NOTE — Telephone Encounter (Signed)
Sent mychart msg informing pt of appt change due to provider schedule change 

## 2022-07-18 IMAGING — CT CT VENOGRAM HEAD
2 of 11 series · 8 of 47 positions shown · non-contrast
Comparison: None Available.

CLINICAL DATA: Dural venous sinus thrombosis suspected; headache
with IIH and mildly elevated CSF WBC (11)

EXAM:
CT VENOGRAM HEAD
TECHNIQUE: Noncontrast CT of the head was performed using standard department
protocol. Venographic phase images of the brain were obtained
following the administration of intravenous contrast. Multiplanar
reformats and maximum intensity projections were generated.

[Series 4: head bone · axial · 0.46mm/px · z∈[+348,+456]mm · 4 of 92 slices shown]
[im 19/92  bone]
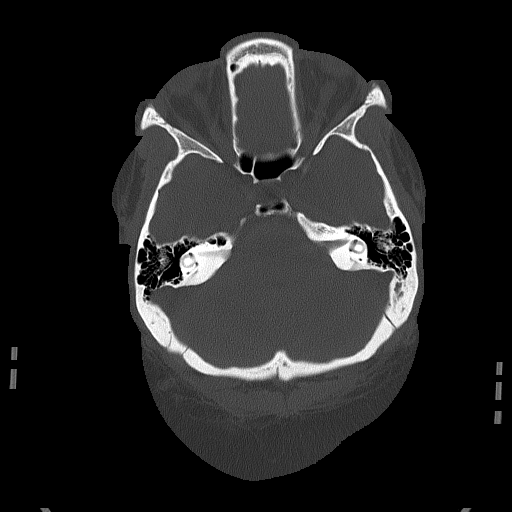
[im 37/92  bone]
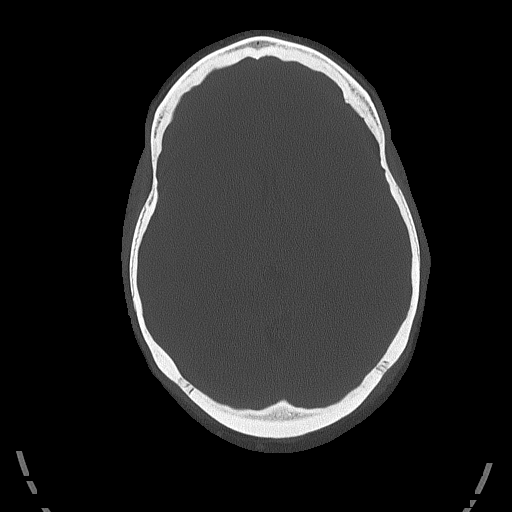
[im 55/92  bone]
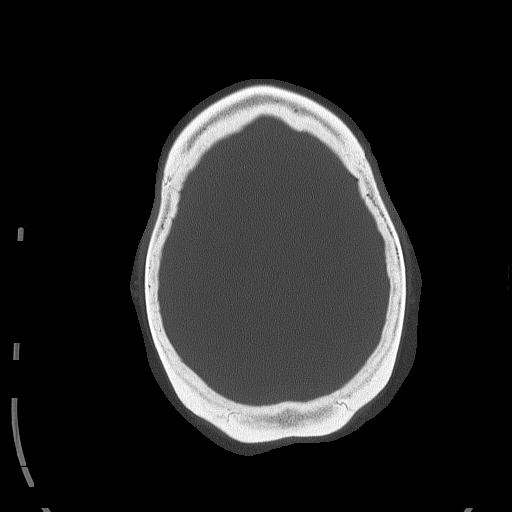
[im 73/92  bone]
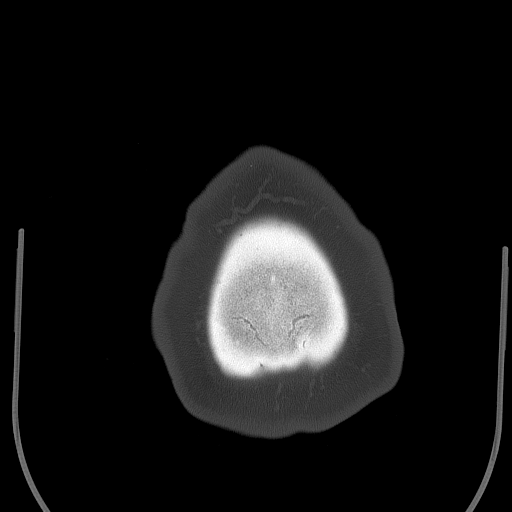

[Series 7: head with ax · axial · 0.47mm/px · z∈[+304,+412]mm · 4 of 92 slices shown]
[im 19/92  brain]
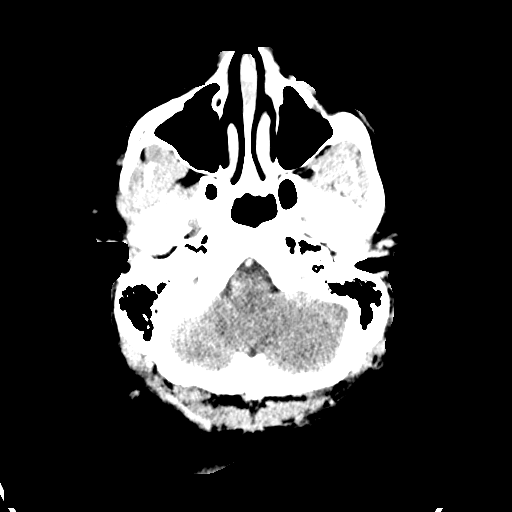
[im 37/92  bone]
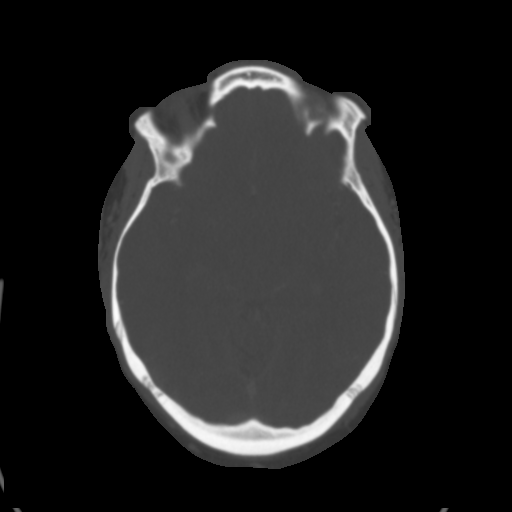
[im 55/92  brain]
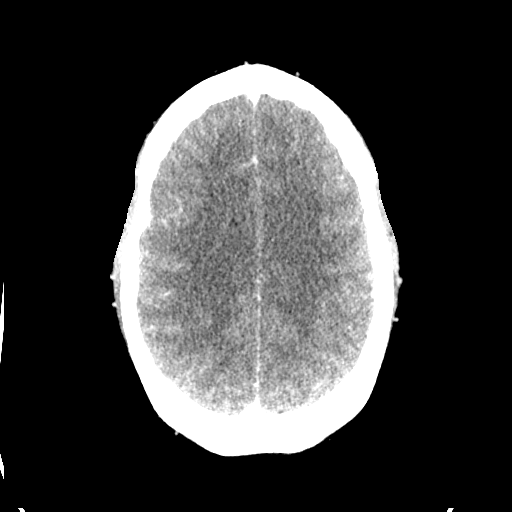
[im 73/92  bone]
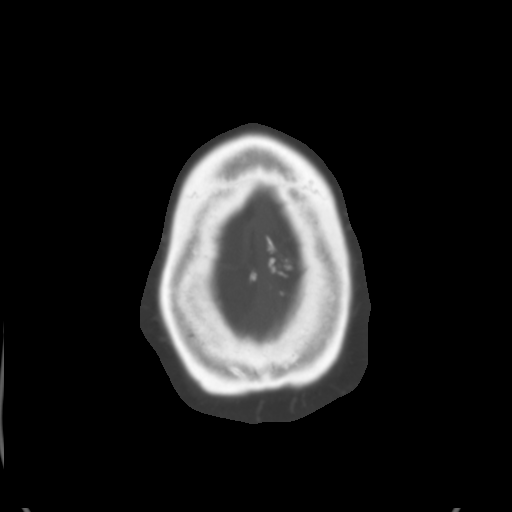

[8 of 47 positions shown; findings below may reference images not displayed]

RADIATION DOSE REDUCTION: This exam was performed according to the
departmental dose-optimization program which includes automated
exposure control, adjustment of the mA and/or kV according to
patient size and/or use of iterative reconstruction technique.

CONTRAST:  80mL OMNIPAQUE IOHEXOL 350 MG/ML SOLN
FINDINGS: CT HEAD:

Brain: There is no acute intracranial hemorrhage, mass effect, or
edema. Gray-white differentiation is preserved. There is no
extra-axial fluid collection. Ventricles and sulci are within normal
limits in size and configuration. On postcontrast venogram images,
there is no abnormal enhancement.

Vascular: No hyperdense vessel.

Skull: Calvarium is unremarkable.

Sinuses/Orbits: No acute finding.

Other: Mastoid air cells are clear. Sella is obscured by streak
artifact.

CTV HEAD:

Superior sagittal sinus, straight sinus, vein of Michio, and internal
cerebral veins are patent. Transverse and sigmoid sinuses are
patent. The right transverse sinus is dominant. No thrombus or
significant stenosis identified.
IMPRESSION: No acute intracranial abnormality.

No dural venous sinus thrombosis.

## 2022-07-24 ENCOUNTER — Ambulatory Visit (INDEPENDENT_AMBULATORY_CARE_PROVIDER_SITE_OTHER): Payer: 59 | Admitting: Family Medicine

## 2022-07-24 ENCOUNTER — Encounter (INDEPENDENT_AMBULATORY_CARE_PROVIDER_SITE_OTHER): Payer: Self-pay | Admitting: Family Medicine

## 2022-07-24 VITALS — BP 119/74 | HR 69 | Temp 97.9°F | Ht 65.0 in | Wt 284.0 lb

## 2022-07-24 DIAGNOSIS — E88819 Insulin resistance, unspecified: Secondary | ICD-10-CM

## 2022-07-24 DIAGNOSIS — E559 Vitamin D deficiency, unspecified: Secondary | ICD-10-CM

## 2022-07-24 DIAGNOSIS — E669 Obesity, unspecified: Secondary | ICD-10-CM | POA: Diagnosis not present

## 2022-07-24 DIAGNOSIS — Z6841 Body Mass Index (BMI) 40.0 and over, adult: Secondary | ICD-10-CM | POA: Diagnosis not present

## 2022-07-24 MED ORDER — METFORMIN HCL 500 MG PO TABS: 500.0000 mg | ORAL_TABLET | Freq: Every day | ORAL | 0 refills | Status: DC

## 2022-07-24 MED ORDER — VITAMIN D (ERGOCALCIFEROL) 1.25 MG (50000 UNIT) PO CAPS: 50000.0000 [IU] | ORAL_CAPSULE | ORAL | 0 refills | Status: DC

## 2022-07-24 NOTE — Progress Notes (Signed)
Chief Complaint:   OBESITY Stephanie Cohen is here to discuss her progress with her obesity treatment plan along with follow-up of her obesity related diagnoses. Stephanie Cohen is on keeping a food journal and adhering to recommended goals of 1700 calories and 120+ protein and states she is following her eating plan approximately 0% of the time. Stephanie Cohen states she is not exercising.  Today's visit was #: 12 Starting weight: 304 LBS Starting date: 10/03/2021 Today's weight: 284 LBS Today's date: 07/24/2022 Total lbs lost to date: 20 LBS Total lbs lost since last in-office visit: +1 LB  Interim History: Patient has had a really stressful time with the end of the semester.  She felt like she needed to go full force to finish successfully.  She is working thru her ADHD diagnosis- going thru [facility] psychology clinic.  She is off until May 16th.  She is trying to get her money in order so she doesn't have to worry as much about that aspect of life.  She wants to organize herself better.    Subjective:   1. Vitamin D deficiency Patient is on prescription Vitamin D.  Patient denies nausea, vomiting, muscle weakness, but is positive for fatigue. Last vitamin D level 25.2 in November.   2. Insulin resistance Patient is on metformin daily.  Patient denies nausea or GI side effects.  Assessment/Plan:   1. Vitamin D deficiency Refill- Vitamin D, Ergocalciferol, (DRISDOL) 1.25 MG (50000 UNIT) CAPS capsule; Take 1 capsule (50,000 Units total) by mouth every 7 (seven) days.  Dispense: 5 capsule; Refill: 0  2. Insulin resistance Refill- metFORMIN (GLUCOPHAGE) 500 MG tablet; Take 1 tablet (500 mg total) by mouth daily with breakfast.  Dispense: 90 tablet; Refill: 0  3. BMI 45.0-49.9, adult ([facility])  4. Obesity with starting BMI of 50.6 Stephanie Cohen is currently in the action stage of change. As such, her goal is to continue with weight loss efforts. She has agreed to keeping a food journal and adhering to recommended  goals of 1700 calories and 120+ protein daily.  Exercise goals: All adults should avoid inactivity. Some physical activity is better than none, and adults who participate in any amount of physical activity gain some health benefits.  Behavioral modification strategies: increasing lean protein intake, meal planning and cooking strategies, keeping healthy foods in the home, and planning for success.  Stephanie Cohen has agreed to follow-up with our clinic in 4 weeks. She was informed of the importance of frequent follow-up visits to maximize her success with intensive lifestyle modifications for her multiple health conditions.   Objective:   Blood pressure 119/74, pulse 69, temperature 97.9 F (36.6 C), height 5\' 5"  (1.651 m), weight 284 lb (128.8 kg), last menstrual period 07/16/2022, SpO2 99 %. Body mass index is 47.26 kg/m.  General: Cooperative, alert, well developed, in no acute distress. HEENT: Conjunctivae and lids unremarkable. Cardiovascular: Regular rhythm.  Lungs: Normal work of breathing. Neurologic: No focal deficits.   Lab Results  Component Value Date   CREATININE 0.90 02/12/2022   BUN 13 02/12/2022   NA 138 02/12/2022   K 3.6 02/12/2022   CL 113 (H) 02/12/2022   CO2 18 (L) 02/12/2022   Lab Results  Component Value Date   ALT 10 02/12/2022   AST 13 (L) 02/12/2022   ALKPHOS 50 02/12/2022   BILITOT 0.4 02/12/2022   Lab Results  Component Value Date   HGBA1C 5.6 01/29/2022   HGBA1C 5.4 10/03/2021   Lab Results  Component Value Date  INSULIN 17.2 01/29/2022   INSULIN 17.7 10/03/2021   Lab Results  Component Value Date   TSH 3.120 10/03/2021   Lab Results  Component Value Date   CHOL 135 10/03/2021   HDL 43 10/03/2021   LDLCALC 78 10/03/2021   TRIG 68 10/03/2021   Lab Results  Component Value Date   VD25OH 25.2 (L) 01/29/2022   VD25OH 13.2 (L) 10/03/2021   Lab Results  Component Value Date   WBC 10.8 (H) 02/12/2022   HGB 12.9 02/12/2022   HCT 40.3  02/12/2022   MCV 59.7 (L) 02/12/2022   PLT 351 02/12/2022   Lab Results  Component Value Date   IRON 96 02/12/2022   TIBC 351 02/12/2022   FERRITIN 34 02/12/2022   Attestation Statements:   Reviewed by clinician on day of visit: allergies, medications, problem list, medical history, surgical history, family history, social history, and previous encounter notes.  I, Malcolm Metro, RMA, am acting as transcriptionist for Reuben Likes, MD.  I have reviewed the above documentation for accuracy and completeness, and I agree with the above. - Reuben Likes, MD

## 2022-08-08 ENCOUNTER — Other Ambulatory Visit: Payer: Self-pay | Admitting: Hematology and Oncology

## 2022-08-29 ENCOUNTER — Ambulatory Visit (INDEPENDENT_AMBULATORY_CARE_PROVIDER_SITE_OTHER): Payer: 59 | Admitting: Family Medicine

## 2022-09-26 ENCOUNTER — Encounter: Payer: Self-pay | Admitting: Neurology

## 2022-10-03 ENCOUNTER — Encounter (INDEPENDENT_AMBULATORY_CARE_PROVIDER_SITE_OTHER): Payer: Self-pay | Admitting: Family Medicine

## 2022-10-03 ENCOUNTER — Ambulatory Visit (INDEPENDENT_AMBULATORY_CARE_PROVIDER_SITE_OTHER): Payer: 59 | Admitting: Family Medicine

## 2022-10-03 VITALS — BP 110/70 | HR 78 | Temp 98.2°F | Ht 65.0 in | Wt 287.0 lb

## 2022-10-03 DIAGNOSIS — D508 Other iron deficiency anemias: Secondary | ICD-10-CM

## 2022-10-03 DIAGNOSIS — E669 Obesity, unspecified: Secondary | ICD-10-CM

## 2022-10-03 DIAGNOSIS — E88819 Insulin resistance, unspecified: Secondary | ICD-10-CM

## 2022-10-03 DIAGNOSIS — E559 Vitamin D deficiency, unspecified: Secondary | ICD-10-CM

## 2022-10-03 DIAGNOSIS — Z6841 Body Mass Index (BMI) 40.0 and over, adult: Secondary | ICD-10-CM | POA: Diagnosis not present

## 2022-10-03 NOTE — Progress Notes (Signed)
Chief Complaint:   OBESITY Stephanie Cohen is here to discuss her progress with her obesity treatment plan along with follow-up of her obesity related diagnoses. Stephanie Cohen is on keeping a food journal and adhering to recommended goals of 1700 calories and 120+ grams of protein and states she is following her eating plan approximately 25% of the time. Stephanie Cohen states she is doing 0 minutes 0 times per week.  Today's visit was #: 13 Starting weight: 304 lbs Starting date: 10/03/2021 Today's weight: 287 lbs Today's date: 10/03/2022 Total lbs lost to date: 17 Total lbs lost since last in-office visit: 0  Interim History: Patient has been focusing mostly on school since last appointment. She takes her final in 2 days and then graduation is next week.  She feels like it is surreal she will be graduating. After graduation she will be studying for the NCLEX and she will be planning to reorganize and refocus on meal plan. She voices that a combination of structure and logging would be the easiest for her to adhere to.  She understands that she needs to get into a habit of logging to just keep herself accountable.   Subjective:   1. Vitamin D deficiency Patient is on prescription vitamin D.  She denies nausea, vomiting, or muscle weakness but notes fatigue.  2. Insulin resistance Patient is on metformin once daily.  Last A1c was 5.6.  3. Other iron deficiency anemia Patient sees hematology/oncology.  She is on iron pills daily.  Assessment/Plan:   1. Vitamin D deficiency We will check labs today, and we will refill prescription vitamin D once weekly for 1 month.  - VITAMIN D 25 Hydroxy (Vit-D Deficiency, Fractures) - Vitamin D, Ergocalciferol, (DRISDOL) 1.25 MG (50000 UNIT) CAPS capsule; Take 1 capsule (50,000 Units total) by mouth every 7 (seven) days.  Dispense: 5 capsule; Refill: 0  2. Insulin resistance We will check labs today, and we will follow-up at patient's next appointment.  -  Comprehensive metabolic panel - Hemoglobin A1c - Insulin, random  3. Other iron deficiency anemia We will check labs today, and we will follow-up at patient's next appointment.  - CBC w/Diff/Platelet - Anemia panel  4. BMI 45.0-49.9, adult (HCC)  5. Obesity with starting BMI of 50.6 Stephanie Cohen is currently in the action stage of change. As such, her goal is to continue with weight loss efforts. She has agreed to the Category 3 Plan and keeping a food journal and adhering to recommended goals of 1450-1600 calories and 90+ grams of protein daily.   Exercise goals: All adults should avoid inactivity. Some physical activity is better than none, and adults who participate in any amount of physical activity gain some health benefits.  Behavioral modification strategies: increasing lean protein intake, meal planning and cooking strategies, keeping healthy foods in the home, and planning for success.  Stephanie Cohen has agreed to follow-up with our clinic in 4 weeks. She was informed of the importance of frequent follow-up visits to maximize her success with intensive lifestyle modifications for her multiple health conditions.   Stephanie Cohen was informed we would discuss her lab results at her next visit unless there is a critical issue that needs to be addressed sooner. Stephanie Cohen agreed to keep her next visit at the agreed upon time to discuss these results.  Objective:   Blood pressure 110/70, pulse 78, temperature 98.2 F (36.8 C), height 5\' 5"  (1.651 m), weight 287 lb (130.2 kg), SpO2 99%. Body mass index is 47.76 kg/m.  General: Cooperative, alert, well developed, in no acute distress. HEENT: Conjunctivae and lids unremarkable. Cardiovascular: Regular rhythm.  Lungs: Normal work of breathing. Neurologic: No focal deficits.   Lab Results  Component Value Date   CREATININE 0.76 10/03/2022   BUN 7 10/03/2022   NA 138 10/03/2022   K 4.1 10/03/2022   CL 110 (H) 10/03/2022   CO2 WILL FOLLOW  10/03/2022   Lab Results  Component Value Date   ALT 11 10/03/2022   AST 18 10/03/2022   ALKPHOS 60 10/03/2022   BILITOT 0.3 10/03/2022   Lab Results  Component Value Date   HGBA1C 5.6 10/03/2022   HGBA1C 5.6 01/29/2022   HGBA1C 5.4 10/03/2021   Lab Results  Component Value Date   INSULIN 25.5 (H) 10/03/2022   INSULIN 17.2 01/29/2022   INSULIN 17.7 10/03/2021   Lab Results  Component Value Date   TSH 3.120 10/03/2021   Lab Results  Component Value Date   CHOL 135 10/03/2021   HDL 43 10/03/2021   LDLCALC 78 10/03/2021   TRIG 68 10/03/2021   Lab Results  Component Value Date   VD25OH 26.9 (L) 10/03/2022   VD25OH 25.2 (L) 01/29/2022   VD25OH 13.2 (L) 10/03/2021   Lab Results  Component Value Date   WBC 8.7 10/03/2022   HGB 13.1 10/03/2022   HCT 44.0 10/03/2022   MCV 68 (L) 10/03/2022   PLT 340 10/03/2022   Lab Results  Component Value Date   IRON 71 10/03/2022   TIBC 288 10/03/2022   FERRITIN 87 10/03/2022   Attestation Statements:   Reviewed by clinician on day of visit: allergies, medications, problem list, medical history, surgical history, family history, social history, and previous encounter notes.   I, Burt Knack, am acting as transcriptionist for Reuben Likes, MD.  I have reviewed the above documentation for accuracy and completeness, and I agree with the above. - Reuben Likes, MD

## 2022-10-04 LAB — HEMOGLOBIN A1C: Est. average glucose Bld gHb Est-mCnc: 114 mg/dL

## 2022-10-04 LAB — CBC WITH DIFFERENTIAL/PLATELET
EOS (ABSOLUTE): 0.1 10*3/uL (ref 0.0–0.4)
Eos: 1 %
Hemoglobin: 13.1 g/dL (ref 11.1–15.9)
Neutrophils Absolute: 5.3 10*3/uL (ref 1.4–7.0)

## 2022-10-04 LAB — COMPREHENSIVE METABOLIC PANEL
AST: 18 IU/L (ref 0–40)
BUN/Creatinine Ratio: 9 (ref 9–23)
Chloride: 110 mmol/L — ABNORMAL HIGH (ref 96–106)
Glucose: 72 mg/dL (ref 70–99)
Total Protein: 7.1 g/dL (ref 6.0–8.5)

## 2022-10-04 LAB — ANEMIA PANEL
Folate, Hemolysate: 258 ng/mL
Iron Saturation: 25 % (ref 15–55)
UIBC: 217 ug/dL (ref 131–425)

## 2022-10-04 MED ORDER — VITAMIN D (ERGOCALCIFEROL) 1.25 MG (50000 UNIT) PO CAPS
50000.0000 [IU] | ORAL_CAPSULE | ORAL | 0 refills | Status: DC
Start: 2022-10-04 — End: 2022-10-31

## 2022-10-06 LAB — CBC WITH DIFFERENTIAL/PLATELET
Basophils Absolute: 0.1 10*3/uL (ref 0.0–0.2)
Basos: 1 %
Immature Granulocytes: 0 %
Lymphs: 32 %
MCH: 20.3 pg — ABNORMAL LOW (ref 26.6–33.0)
MCV: 68 fL — ABNORMAL LOW (ref 79–97)
Monocytes: 6 %
RDW: 18.2 % — ABNORMAL HIGH (ref 11.7–15.4)
WBC: 8.7 10*3/uL (ref 3.4–10.8)

## 2022-10-06 LAB — COMPREHENSIVE METABOLIC PANEL
Albumin: 4.1 g/dL (ref 4.0–5.0)
Globulin, Total: 3 g/dL (ref 1.5–4.5)
Potassium: 4.1 mmol/L (ref 3.5–5.2)

## 2022-10-06 LAB — ANEMIA PANEL
Folate, RBC: 586 ng/mL (ref >498)
Iron: 71 ug/dL (ref 27–159)
Vitamin B-12: 342 pg/mL (ref 232–1245)

## 2022-10-08 LAB — VITAMIN D 25 HYDROXY (VIT D DEFICIENCY, FRACTURES): Vit D, 25-Hydroxy: 26.9 ng/mL — ABNORMAL LOW (ref 30.0–100.0)

## 2022-10-08 LAB — COMPREHENSIVE METABOLIC PANEL
ALT: 11 IU/L (ref 0–32)
Alkaline Phosphatase: 60 IU/L (ref 44–121)
BUN: 7 mg/dL (ref 6–20)
Bilirubin Total: 0.3 mg/dL (ref 0.0–1.2)
Calcium: 8.7 mg/dL (ref 8.7–10.2)
Creatinine, Ser: 0.76 mg/dL (ref 0.57–1.00)
Sodium: 138 mmol/L (ref 134–144)
eGFR: 112 mL/min/{1.73_m2} (ref >59)

## 2022-10-08 LAB — HEMOGLOBIN A1C: Hgb A1c MFr Bld: 5.6 % (ref 4.8–5.6)

## 2022-10-08 LAB — CBC WITH DIFFERENTIAL/PLATELET
Immature Grans (Abs): 0 10*3/uL (ref 0.0–0.1)
Lymphocytes Absolute: 2.8 10*3/uL (ref 0.7–3.1)
MCHC: 29.8 g/dL — ABNORMAL LOW (ref 31.5–35.7)
Monocytes Absolute: 0.5 10*3/uL (ref 0.1–0.9)
Neutrophils: 60 %
Platelets: 340 10*3/uL (ref 150–450)
RBC: 6.46 x10E6/uL — ABNORMAL HIGH (ref 3.77–5.28)

## 2022-10-08 LAB — ANEMIA PANEL
Ferritin: 87 ng/mL (ref 15–150)
Hematocrit: 44 % (ref 34.0–46.6)
Retic Ct Pct: 1.1 % (ref 0.6–2.6)
Total Iron Binding Capacity: 288 ug/dL (ref 250–450)

## 2022-10-08 LAB — INSULIN, RANDOM: INSULIN: 25.5 u[IU]/mL — ABNORMAL HIGH (ref 2.6–24.9)

## 2022-10-22 ENCOUNTER — Other Ambulatory Visit (INDEPENDENT_AMBULATORY_CARE_PROVIDER_SITE_OTHER): Payer: Self-pay | Admitting: Family Medicine

## 2022-10-22 DIAGNOSIS — E88819 Insulin resistance, unspecified: Secondary | ICD-10-CM

## 2022-10-23 MED ORDER — METFORMIN HCL 500 MG PO TABS
500.0000 mg | ORAL_TABLET | Freq: Every day | ORAL | 0 refills | Status: DC
Start: 2022-10-23 — End: 2023-01-30

## 2022-10-31 ENCOUNTER — Ambulatory Visit (INDEPENDENT_AMBULATORY_CARE_PROVIDER_SITE_OTHER): Payer: 59 | Admitting: Family Medicine

## 2022-10-31 ENCOUNTER — Encounter (INDEPENDENT_AMBULATORY_CARE_PROVIDER_SITE_OTHER): Payer: Self-pay | Admitting: Family Medicine

## 2022-10-31 VITALS — BP 105/70 | HR 76 | Temp 98.2°F | Ht 65.0 in | Wt 282.0 lb

## 2022-10-31 DIAGNOSIS — Z6841 Body Mass Index (BMI) 40.0 and over, adult: Secondary | ICD-10-CM | POA: Diagnosis not present

## 2022-10-31 DIAGNOSIS — D508 Other iron deficiency anemias: Secondary | ICD-10-CM

## 2022-10-31 DIAGNOSIS — E669 Obesity, unspecified: Secondary | ICD-10-CM | POA: Diagnosis not present

## 2022-10-31 DIAGNOSIS — E559 Vitamin D deficiency, unspecified: Secondary | ICD-10-CM

## 2022-10-31 MED ORDER — CHOLECALCIFEROL 1.25 MG (50000 UT) PO TABS
1.0000 | ORAL_TABLET | ORAL | 0 refills | Status: DC
Start: 2022-10-31 — End: 2022-12-26

## 2022-10-31 NOTE — Progress Notes (Unsigned)
Chief Complaint:   OBESITY Stephanie Cohen is here to discuss her progress with her obesity treatment plan along with follow-up of her obesity related diagnoses. Stephanie Cohen is on the Category 3 Plan and keeping a food journal and adhering to recommended goals of 1450-1600 calories and 90+ grams of protein and states she is following her eating plan approximately 85% of the time. Stephanie Cohen states she is doing 0 minutes 0 times per week.  Today's visit was #: 14 Starting weight: 304 lbs Starting date: 10/03/2021 Today's weight: 282 lbs Today's date: 10/31/2022 Total lbs lost to date: 22 Total lbs lost since last in-office visit: 5  Interim History: Since last appointment she has been doing a significant amount of working.  Different shifts and hours ; sleep has increased due to inconsistency.  This has impacted her food intake as she can be a bit late with tracking her food increase (even a few days late).  She has stayed within her calories but isn't necessarily hitting her protein content- partially due to finances and food availability. For the next month she is prepping for the NCLEX and working.  She may be moving to a different floor in [facility] as well.  She feels like she can be consistent with her calorie intake and will pay more attention to total protein.  Subjective:   1. Other iron deficiency anemia Patient is recent MCV increased from 59-68.  She is taking OTC iron.  I discussed labs with the patient today.  2. Vitamin D deficiency Patient has been on prescription vitamin D for the last year, but her initial increased of Vitamin D level then stalled at 26.9.  I discussed labs with the patient today.  Assessment/Plan:   1. Other iron deficiency anemia Patient will continue OTC iron at least once daily.  2. Vitamin D deficiency Patient is to stop ergocalciferol as she has not had an increase in her vitamin D level.  She agreed to start cholecalciferol 50,000 IU once weekly with a 90-day  supply.  We will repeat vitamin D level in 3 months.  - Cholecalciferol 1.25 MG (50000 UT) TABS; Take 1 tablet by mouth once a week.  Dispense: 12 tablet; Refill: 0  3. BMI 45.0-49.9, adult (HCC)  4. Obesity with starting BMI of 50.6 Stephanie Cohen is currently in the action stage of change. As such, her goal is to continue with weight loss efforts. She has agreed to the Category 3 Plan and keeping a food journal and adhering to recommended goals of 1450-1600 calories and 90+ grams of protein daily.   Exercise goals: No exercise has been prescribed at this time.  Behavioral modification strategies: increasing lean protein intake, meal planning and cooking strategies, keeping healthy foods in the home, planning for success, and keeping a strict food journal.  Stephanie Cohen has agreed to follow-up with our clinic in 6 to 7 weeks. She was informed of the importance of frequent follow-up visits to maximize her success with intensive lifestyle modifications for her multiple health conditions.   Objective:   Blood pressure 105/70, pulse 76, temperature 98.2 F (36.8 C), height 5\' 5"  (1.651 m), weight 282 lb (127.9 kg), SpO2 98%. Body mass index is 46.93 kg/m.  General: Cooperative, alert, well developed, in no acute distress. HEENT: Conjunctivae and lids unremarkable. Cardiovascular: Regular rhythm.  Lungs: Normal work of breathing. Neurologic: No focal deficits.   Lab Results  Component Value Date   CREATININE 0.76 10/03/2022   BUN 7 10/03/2022  NA 138 10/03/2022   K 4.1 10/03/2022   CL 110 (H) 10/03/2022   CO2 CANCELED 10/03/2022   Lab Results  Component Value Date   ALT 11 10/03/2022   AST 18 10/03/2022   ALKPHOS 60 10/03/2022   BILITOT 0.3 10/03/2022   Lab Results  Component Value Date   HGBA1C 5.6 10/03/2022   HGBA1C 5.6 01/29/2022   HGBA1C 5.4 10/03/2021   Lab Results  Component Value Date   INSULIN 25.5 (H) 10/03/2022   INSULIN 17.2 01/29/2022   INSULIN 17.7 10/03/2021    Lab Results  Component Value Date   TSH 3.120 10/03/2021   Lab Results  Component Value Date   CHOL 135 10/03/2021   HDL 43 10/03/2021   LDLCALC 78 10/03/2021   TRIG 68 10/03/2021   Lab Results  Component Value Date   VD25OH 26.9 (L) 10/03/2022   VD25OH 25.2 (L) 01/29/2022   VD25OH 13.2 (L) 10/03/2021   Lab Results  Component Value Date   WBC 8.7 10/03/2022   HGB 13.1 10/03/2022   HCT 44.0 10/03/2022   MCV 68 (L) 10/03/2022   PLT 340 10/03/2022   Lab Results  Component Value Date   IRON 71 10/03/2022   TIBC 288 10/03/2022   FERRITIN 87 10/03/2022   Attestation Statements:   Reviewed by clinician on day of visit: allergies, medications, problem list, medical history, surgical history, family history, social history, and previous encounter notes.   I, Burt Knack, am acting as transcriptionist for Reuben Likes, MD.  I have reviewed the above documentation for accuracy and completeness, and I agree with the above. - Reuben Likes, MD

## 2022-11-05 ENCOUNTER — Telehealth: Payer: 59 | Admitting: Neurology

## 2022-11-08 ENCOUNTER — Telehealth (INDEPENDENT_AMBULATORY_CARE_PROVIDER_SITE_OTHER): Payer: Self-pay | Admitting: Neurology

## 2022-11-08 ENCOUNTER — Telehealth: Payer: Self-pay | Admitting: Neurology

## 2022-11-08 DIAGNOSIS — G43009 Migraine without aura, not intractable, without status migrainosus: Secondary | ICD-10-CM

## 2022-11-08 DIAGNOSIS — G932 Benign intracranial hypertension: Secondary | ICD-10-CM

## 2022-11-08 NOTE — Progress Notes (Signed)
[facility]    Provider:  Dr Lucia Gaskins Requesting Provider: No ref. provider found Primary Care Provider:  Patient, No Pcp Per   Virtual Visit via Video Note  I connected with Stephanie Cohen on 11/08/22 at  8:00 AM EDT by a video enabled telemedicine application and verified that I am speaking with the correct person using two identifiers.  Location: Patient: home Provider: office   I discussed the limitations of evaluation and management by telemedicine and the availability of in person appointments. The patient expressed understanding and agreed to proceed.    Follow Up Instructions:    I discussed the assessment and treatment plan with the patient. The patient was provided an opportunity to ask questions and all were answered. The patient agreed with the plan and demonstrated an understanding of the instructions.   The patient was advised to call back or seek an in-person evaluation if the symptoms worsen or if the condition fails to improve as anticipated.  I provided 30 minutes of non-face-to-face time during this encounter.   Anson Fret, MD  CC:  IDIOPATHIC INTRACRANIAL HYPERTENSION   02/17/2023: Here for follow up of IIH  03/08/2022, patient is here for follow-up of IIH: I reviewed Dr. Laruth Bouchard note he just saw her in ophthalmology on February 26, 2022 for her papilledema which she said is worse OS greater than OD but "MUCH BETTER" confirmed with opening pressure of 36 an LP otherwise unrevealing MRI MRV.  His recommendations were to continue acetazolamide at present dose which was 1500 mg daily and continue weight loss, they will follow-up with him in 6 months for an extended exam including HVF and OCT.  She had severe edema which was resolved bilaterally.  Visual acuity OD was 2020 and OS was 20/30.  08/2021 she was 308 and now 288. If get to 250 I would decrease diamox. Discuss with primary care about wegovy.   Sent not to Dr. Lawson Radar at weight  loss center to see if wegovy or zepbound might help. I'd like her to come into the office in August or can be video if she sees Dr. Dione Booze.   No headaches, no issues, no vision changes, no side effects to the medications. Appt with Dr. Dione Booze is in June.   Patient complains of symptoms per HPI as well as the following symptoms: IIH . Pertinent negatives and positives per HPI. All others negative   11/21/2021: Patient with IDIOPATHIC INTRACRANIAL HYPERTENSION, ran out of her medications. Sge was titrated back onto 500mg  twice daily of acetazolamide. No blackening os vision, no new vision problems, no wooshing of the ears, papilledema has improved today, still has npt gone to see dr Dione Booze she rescheduled her appointment with Dr. Dione Booze and I encouraged her to go. She is on 750mg  bid diamox. She has other types of headaches, pulsating pounding throbbing, light and sound sensitivity, moderate, a little queasy, movement makes it worse, different than the pressure. She has tried rizatriptan, sumatriptan, OTC meds, nothing helped acutely. No other focal neurologic deficits, associated symptoms, inciting events or modifiable factors.4 migraine days a months, < 10 total headache days a month. Needs to call Dr. Dione Booze for repeat eye exam.  Patient complains of symptoms per HPI as well as the following symptoms: migraines . Pertinent negatives and positives per HPI. All others negative    HPI:  Stephanie Cohen is a 22 y.o. female here as requested by No ref. provider found for IDIOPATHIC INTRACRANIAL HYPERTENSION. Sent to the  emergency room from Dr. Dione Booze (Ophthalmology). She was on diamox and ran out. 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   She is feeling better. She has reduced appetite. She  started having episodes of vision loss, wooshing in the ears, pressure headache. She has improved, no complete vision loss. She is feeling better now, headaches improved, swishing reduced, no more vison changes, we discussed IDIOPATHIC INTRACRANIAL HYPERTENSION and treatment, risk of vision loss, they were very educated on the syndrome, discussed weight loss. Mother is her an provides much information. No other focal neurologic deficits, associated symptoms, inciting events or modifiable factors.   Reviewed notes, labs and imaging from outside physicians, which showed:  MRI 08/08/2021: IMPRESSION: MRI head:   No evidence of acute intracranial abnormality.   MRI of the orbits:   1. Question slight protrusion of the optic papilla bilaterally and mild prominence of the optic nerve sheaths, which is nonspecific but could potentially represent papilledema. Consider correlation with fundoscopic exam. 2. Strabismus.  CTV: IMPRESSION: No acute intracranial abnormality.   No dural venous sinus thrombosis.  Review of Systems: Patient complains of symptoms per HPI as well as the following symptoms headache impoved. Pertinent negatives and positives per HPI. All others negative.   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    Comment: Mirena insertion on 10/30/2021  Other Topics Concern   Not on file  Social History Narrative   Caffiene occasional Rare.      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 Strain: Not on [employer] Insecurity: Not on file  Transportation Needs: Not on file  Physical Activity: Not on file  Stress: Not on file  Social Connections: Not on file  Intimate Partner Violence: Not on file    Family History  Problem Relation Age of Onset   Obesity Mother    Obesity Father    Asthma Father    Diabetes Father    Crohn's disease Maternal Grandmother    Diabetes Paternal Grandmother    Stroke Neg Hx     Past Medical History:  Diagnosis Date   ADHD    Asthma    Back pain    IIH (idiopathic intracranial hypertension)    Joint pain    PCOS (polycystic ovarian syndrome)     Patient Active Problem List   Diagnosis Date Noted   Food aversion 04/23/2022   Class 3 severe obesity with serious comorbidity and body mass index (BMI) of 50.0 to 59.9 in adult (HCC) 04/23/2022   Intracranial hypertension 12/26/2021   Insulin resistance 11/28/2021   Vitamin D deficiency 11/28/2021   Absolute anemia 11/28/2021  IIH (idiopathic intracranial hypertension) 11/21/2021   Migraine without aura and without status migrainosus, not intractable 11/21/2021   Pseudotumor cerebri 08/09/2021   Idiopathic intracranial hypertension 08/08/2021   Mild intermittent asthma without complication 08/08/2021   Class 3 severe obesity due to excess calories without serious comorbidity with body mass index (BMI) of 50.0 to 59.9 in adult [facility]) 08/08/2021   Leukocytosis 08/08/2021   History of ADHD 12/05/2020   Referred for medication therapy management 12/05/2020    Past Surgical History:  Procedure Laterality Date   NO PAST SURGERIES      Current Outpatient Medications  Medication Sig Dispense Refill   acetaZOLAMIDE (DIAMOX) 250 MG tablet Take 3 tablets (750 mg total) by mouth 2 (two) times daily. 360 tablet 4   cetirizine (ZYRTEC) 10 MG tablet Take 10 mg by mouth 2 (two) times daily.      Cholecalciferol 1.25 MG (50000 UT) TABS Take 1 tablet by mouth once a week. 12 tablet 0   ferrous sulfate 325 (65 FE) MG tablet TAKE 1 TABLET BY MOUTH EVERY DAY WITH BREAKFAST 90 tablet 2   metFORMIN (GLUCOPHAGE) 500 MG tablet Take 1 tablet (500 mg total) by mouth daily with breakfast. 90 tablet 0   Ubrogepant (UBRELVY) 100 MG TABS Take 100 mg by mouth every 2 (two) hours as needed. Maximum 200mg  a day. 16 tablet 11   Current Facility-Administered Medications  Medication Dose Route Frequency Provider Last Rate Last Admin   levonorgestrel (MIRENA) 20 MCG/DAY IUD 1 each  1 each Intrauterine Once Wyline Beady A, NP        Allergies as of 11/08/2022 - Review Complete 10/31/2022  Allergen Reaction Noted   Antihistamines, loratadine-type Hives 11/13/2021    Vitals: There were no vitals taken for this visit. Last Weight:  Wt Readings from Last 1 Encounters:  10/31/22 282 lb (127.9 kg)   Last Height:   Ht Readings from Last 1 Encounters:  10/31/22 5\' 5"  (1.651 m)   Physical exam: Exam: Gen: NAD, conversant      CV: Denies palpitations or chest pain or SOB. VS: Breathing at a normal rate. Weight appears obese. Not febrile. Eyes: Conjunctivae clear without exudates or hemorrhage  Neuro: Detailed Neurologic Exam  Speech:    Speech is normal; fluent and spontaneous with normal comprehension.  Cognition:    The patient is oriented to person, place, and time;     recent and remote memory intact;     language fluent;     normal attention, concentration,     fund of knowledge Cranial Nerves:    The pupils are equal, round, and reactive to light.  Visual fields are full. Extraocular movements are intact.  The face is symmetric with normal sensation. The palate elevates in the midline. Hearing intact. Voice is normal. Shoulder shrug is normal. The tongue has normal motion without fasciculations.   Coordination:    Normal finger to nose  Gait:    Normal native gait  Motor  Observation:   no involuntary movements noted. Tone:    Appears normal  Posture:    Posture is normal. normal erect    Strength:    Strength is anti-gravity and symmetric in the upper and lower limbs.      Sensation: intact to LT          Assessment/Plan:  patient with IDIOPATHIC INTRACRANIAL HYPERTENSION; opening pressure on LP was 36, MRI showed mild prominence of the optic nerve sheaths, fundoscopic exam showed grade 3 papilledema  and vision loss, CTV showed no stenosis or sinus thrombosis, she was restarted on her diamox.  Symptoms and papilledema improved. She is on 750mg  BID and doing well but having episodic migraines.  patient is here for follow-up of IIH: I reviewed Dr. Laruth Bouchard note he just saw her in ophthalmology on February 26, 2022 for her papilledema which she said is worse OS greater than OD but "MUCH BETTER" confirmed with opening pressure of 36 an LP otherwise unrevealing MRI MRV.  His recommendations were to continue acetazolamide at present dose which was 1500 mg daily and continue weight loss, they will follow-up with him in 6 months for an extended exam including HVF and OCT.  She had severe edema which was resolved bilaterally.  Visual acuity OD was 2020 and OS was 20/30.  08/2021 she was 308 and now 288. If get to 250lbs I would decrease diamox. Discuss with primary care about wegovy.   Sent not to Dr. Lawson Radar at weight loss center to see if wegovy or zepbound might help. I'd like her to come into the office in August or can be video if she sees Dr. Dione Booze.   Continue diamox  [facility] she seeing Dr. Lawson Radar. Weight loss is critical, discussed weight loss and risk of permanent vision loss, she is going [facility] the [facility]  She denies any symptoms of sleep apnea, her vision is improved, improved episodic migraine, Ubrelvy prn continue  Patient has an IUD, discussed association, but she had IDIOPATHIC INTRACRANIAL  HYPERTENSION much before the IUD     For any acute change especially worsening headache or vision loss call 911 and proceed to ED  No orders of the defined types were placed in this encounter.   Cc: No ref. provider found,  Patient, No Pcp Per  Naomie Dean, MD  [facility] [address] [place], [place] [address]  Phone [phone] Fax [phone]

## 2022-11-08 NOTE — Telephone Encounter (Signed)
Pt 30 mins late logging on for MyChart video visit due to oversleeping. Informed pt there will be a no show fee $50. Pt hung up, while in the process of transferring to Billing.

## 2022-11-12 NOTE — Patient Instructions (Signed)
Patient left.

## 2022-11-13 ENCOUNTER — Telehealth: Payer: Self-pay | Admitting: Neurology

## 2022-11-13 ENCOUNTER — Telehealth (INDEPENDENT_AMBULATORY_CARE_PROVIDER_SITE_OTHER): Payer: 59 | Admitting: Neurology

## 2022-11-13 DIAGNOSIS — G932 Benign intracranial hypertension: Secondary | ICD-10-CM | POA: Diagnosis not present

## 2022-11-13 DIAGNOSIS — G43009 Migraine without aura, not intractable, without status migrainosus: Secondary | ICD-10-CM | POA: Diagnosis not present

## 2022-11-13 MED ORDER — ACETAZOLAMIDE 250 MG PO TABS: 750.0000 mg | ORAL_TABLET | Freq: Two times a day (BID) | ORAL | 3 refills | Status: DC

## 2022-11-13 NOTE — Progress Notes (Signed)
[facility]    Provider:  Dr Lucia Gaskins Requesting Provider: No ref. provider found Primary Care Provider:  Patient, No Pcp Per   Virtual Visit via Video Note  I connected with Stephanie Cohen on 11/13/22 at 11:00 AM EDT by a video enabled telemedicine application and verified that I am speaking with the correct person using two identifiers.  Location: Patient: home Provider: office   I discussed the limitations of evaluation and management by telemedicine and the availability of in person appointments. The patient expressed understanding and agreed to proceed.    Follow Up Instructions:    I discussed the assessment and treatment plan with the patient. The patient was provided an opportunity to ask questions and all were answered. The patient agreed with the plan and demonstrated an understanding of the instructions.   The patient was advised to call back or seek an in-person evaluation if the symptoms worsen or if the condition fails to improve as anticipated.  I provided over 20 minutes of non-face-to-face time during this encounter.   Anson Fret, MD  CC:  IDIOPATHIC INTRACRANIAL HYPERTENSION   11/13/2022: Patient is here for follow-up of IIH.  The last note I have from Dr. Alden Hipp are from February 26, 2022 for papilledema was doing "much better" confirmed with opening pressure of 36 on LP otherwise unrevealing MRI MRV.  His recommendations were to continue acetazolamide at present doses which were 1500 mg daily and continue weight loss.  There was post to follow-up with [facility] in 6 months for an extended exam including HVF and OCT.  She had severe edema which was resolved bilaterally, visual acuity OD was 2020 and OS was 20/30, at last appointment she had lost about 20 pounds.  At last visit she had no headaches, no changes, no significant symptoms.  All has been OK. She has occassional migraines when stressed. Unknown stress. Once a month, she is  trying to decide if the Constance Haw is effective, she leans more towards the excedrin and this is ok as long as she doesn't take more than one time a month. No vision changes. She sae Dr. Dione Booze in January and things wree much better. She needs to see Dr. Dione Booze, I encouraged her to do so. No vision changes, no frequent headaches, occ migraine. She is still on 750mg  2x a day. Hasn't lost any weight, doing well, will keep her on it until see by Dr. 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   03/08/2022, patient is here for follow-up of IIH: I reviewed Dr. Laruth Bouchard note he just saw her in ophthalmology on February 26, 2022 for her papilledema which she said is worse OS greater than OD but "MUCH BETTER" confirmed with opening pressure of 36 an LP otherwise unrevealing MRI MRV.  His recommendations were to continue acetazolamide at present dose which was 1500 mg daily and continue weight loss, they will follow-up with him in 6 months for an extended exam including HVF and OCT.  She had severe edema which was resolved bilaterally.  Visual acuity OD was 2020 and OS was 20/30.  08/2021 she was 308 and now 288. If get to 250 I would decrease diamox. Discuss with primary care about wegovy.   Sent not to Dr. Lawson Radar at weight loss center to see if wegovy or zepbound might help. I'd like her to come into the office in August or can be video if she sees Dr. Dione Booze.  No headaches, no issues, no vision changes, no side effects to the medications. Appt with Dr. Dione Booze is in June.   Patient complains of symptoms per HPI as well as the following symptoms: IIH . Pertinent negatives and positives per HPI. All others negative   11/21/2021: Patient with IDIOPATHIC INTRACRANIAL HYPERTENSION, ran out of her medications. Sge was titrated back onto 500mg  twice daily of acetazolamide. No blackening os vision, no new vision problems, no wooshing of the ears, papilledema has improved today, still  has npt gone to see dr Dione Booze she rescheduled her appointment with Dr. Dione Booze and I encouraged her to go. She is on 750mg  bid diamox. She has other types of headaches, pulsating pounding throbbing, light and sound sensitivity, moderate, a little queasy, movement makes it worse, different than the pressure. She has tried rizatriptan, sumatriptan, OTC meds, nothing helped acutely. No other focal neurologic deficits, associated symptoms, inciting events or modifiable factors.4 migraine days a months, < 10 total headache days a month. Needs to call Dr. Dione Booze for repeat eye exam.  Patient complains of symptoms per HPI as well as the following symptoms: migraines . Pertinent negatives and positives per HPI. All others negative    HPI:  Stephanie Cohen is a 22 y.o. female here as requested by No ref. provider found for IDIOPATHIC INTRACRANIAL HYPERTENSION. Sent to the emergency room from Dr. Dione Booze (Ophthalmology). She was on diamox and ran out. 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   She is feeling better. She has reduced appetite. She started having episodes of vision loss, wooshing in the ears, pressure headache. She has improved, no complete vision loss. She is feeling better now, headaches improved, swishing reduced, no more vison changes, we discussed IDIOPATHIC INTRACRANIAL HYPERTENSION and treatment, risk of vision loss, they were very educated on the syndrome, discussed weight loss. Mother is her an provides much information. No other focal neurologic deficits, associated symptoms, inciting events or modifiable factors.   Reviewed notes, labs and imaging from outside physicians, which showed:  MRI 08/08/2021: IMPRESSION: MRI head:   No evidence of acute intracranial abnormality.    MRI of the orbits:   1. Question slight protrusion of the optic papilla bilaterally and mild prominence of the optic nerve sheaths, which is nonspecific but could potentially represent papilledema. Consider correlation with fundoscopic exam. 2. Strabismus.  CTV: IMPRESSION: No acute intracranial abnormality.   No dural venous sinus thrombosis.  Review of Systems: Patient complains of symptoms per HPI as well as the following symptoms headache impoved. Pertinent negatives and positives per HPI. All others negative.   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    Comment: Mirena insertion  on 10/30/2021  Other Topics Concern   Not on file  Social History Narrative   Caffiene occasional Rare.     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 Strain: Not on [employer] Insecurity: Not on file  Transportation Needs: Not on file  Physical Activity: Not on file  Stress: Not on file  Social Connections: Not on file  Intimate Partner Violence: Not on file    Family History  Problem Relation Age of Onset   Obesity Mother    Obesity Father    Asthma Father    Diabetes Father    Crohn's disease Maternal Grandmother    Diabetes Paternal Grandmother    Stroke Neg Hx     Past Medical History:  Diagnosis Date   ADHD    Asthma    Back pain    IIH (idiopathic intracranial hypertension)    Joint pain    PCOS  (polycystic ovarian syndrome)     Patient Active Problem List   Diagnosis Date Noted   Food aversion 04/23/2022   Class 3 severe obesity with serious comorbidity and body mass index (BMI) of 50.0 to 59.9 in adult (HCC) 04/23/2022   Intracranial hypertension 12/26/2021   Insulin resistance 11/28/2021   Vitamin D deficiency 11/28/2021   Absolute anemia 11/28/2021   IIH (idiopathic intracranial hypertension) 11/21/2021   Migraine without aura and without status migrainosus, not intractable 11/21/2021   Pseudotumor cerebri 08/09/2021   Idiopathic intracranial hypertension 08/08/2021   Mild intermittent asthma without complication 08/08/2021   Class 3 severe obesity due to excess calories without serious comorbidity with body mass index (BMI) of 50.0 to 59.9 in adult (HCC) 08/08/2021   Leukocytosis 08/08/2021   History of ADHD 12/05/2020   Referred for medication therapy management 12/05/2020    Past Surgical History:  Procedure Laterality Date   NO PAST SURGERIES      Current Outpatient Medications  Medication Sig Dispense Refill   acetaZOLAMIDE (DIAMOX) 250 MG tablet Take 3 tablets (750 mg total) by mouth 2 (two) times daily. 540 tablet 3   cetirizine (ZYRTEC) 10 MG tablet Take 10 mg by mouth 2 (two) times daily.     Cholecalciferol 1.25 MG (50000 UT) TABS Take 1 tablet by mouth once a week. 12 tablet 0   ferrous sulfate 325 (65 FE) MG tablet TAKE 1 TABLET BY MOUTH EVERY DAY WITH BREAKFAST 90 tablet 2   metFORMIN (GLUCOPHAGE) 500 MG tablet Take 1 tablet (500 mg total) by mouth daily with breakfast. 90 tablet 0   Ubrogepant (UBRELVY) 100 MG TABS Take 100 mg by mouth every 2 (two) hours as needed. Maximum 200mg  a day. 16 tablet 11   Current Facility-Administered Medications  Medication Dose Route Frequency Provider Last Rate Last Admin   levonorgestrel (MIRENA) 20 MCG/DAY IUD 1 each  1 each Intrauterine Once Wyline Beady A, NP        Allergies as of 11/13/2022 - Review  Complete 10/31/2022  Allergen Reaction Noted   Antihistamines, loratadine-type Hives 11/13/2021    Vitals: There were no vitals taken for this visit. Last Weight:  Wt Readings from Last 1 Encounters:  10/31/22 282 lb (127.9 kg)   Last Height:   Ht Readings from Last 1 Encounters:  10/31/22 5\' 5"  (1.651 m)   Physical exam: Exam: Gen: NAD, conversant      CV: Denies palpitations or chest pain or SOB. VS: Breathing at a normal rate. Weight  appears obese. Not febrile. Eyes: Conjunctivae clear without exudates or hemorrhage  Neuro: Detailed Neurologic Exam  Speech:    Speech is normal; fluent and spontaneous with normal comprehension.  Cognition:    The patient is oriented to person, place, and time;     recent and remote memory intact;     language fluent;     normal attention, concentration,     fund of knowledge Cranial Nerves:    The pupils are equal, round, and reactive to light.  Visual fields are full. Extraocular movements are intact.  The face is symmetric with normal sensation. The palate elevates in the midline. Hearing intact. Voice is normal. Shoulder shrug is normal. The tongue has normal motion without fasciculations.   Coordination:    Normal finger to nose  Gait:    Normal native gait  Motor Observation:   no involuntary movements noted. Tone:    Appears normal  Posture:    Posture is normal. normal erect    Strength:    Strength is anti-gravity and symmetric in the upper and lower limbs.      Sensation: intact to LT          Assessment/Plan:  patient with IDIOPATHIC INTRACRANIAL HYPERTENSION; opening pressure on LP was 36, MRI showed mild prominence of the optic nerve sheaths, fundoscopic exam showed grade 3 papilledema and vision loss, CTV showed no stenosis or sinus thrombosis, she was restarted on her diamox.  Symptoms and papilledema improved. She is on 750mg  BID and doing well but having episodic migraines.  patient is here for follow-up  of IIH: I reviewed Dr. Laruth Bouchard note he saw her in ophthalmology on February 26, 2022 for her papilledema which she said is worse OS greater than OD but  Dr Dione Booze stated "MUCH BETTER" , IDIOPATHIC INTRACRANIAL HYPERTENSION confirmed with opening pressure of 36 on LP otherwise unrevealing MRI MRV.  Dr. Laruth Bouchard recommendations were to continue acetazolamide at present dose which was 1500 mg daily and continue weight loss, she was supposed to follow-up with Dr. Dione Booze in 6 months for an extended exam including HVF and OCT but she has not this year.  She had severe edema which was resolved bilaterally per Dr. Dione Booze.  Visual acuity OD was 2020 and OS was 20/30.  If loses weight can slowly decrease diamox. Discuss with primary care about wegovy.   Sent not to Dr. Lawson Radar at weight loss center to see if wegovy or zepbound might help. I'd like her to come into the office in August or can be video if she sees Dr. Dione Booze. She didn't go see Dr. Dione Booze this year or go to the [facility]. Encouraged her to go again  Continue diamox at current dose. Come to the neuro office in 6-7 months, prior to that see Dr. Dione Booze for follow up so we have note  [facility] she seeing Dr. Lawson Radar. Weight loss is critical, discussed weight loss and risk of permanent vision loss, she is going to the [facility], looks like she stopped or didn;t go, she was busy finishing nursing school  She denies any symptoms of sleep apnea, her vision is improved, improved episodic migraine, Ubrelvy prn continue  Patient has an IUD, discussed association, but she had IDIOPATHIC INTRACRANIAL HYPERTENSION much before the IUD    For any acute change especially worsening headache or vision loss call 911 and proceed to ED  Meds ordered this encounter  Medications   acetaZOLAMIDE (DIAMOX) 250  MG tablet    Sig: Take 3 tablets (750 mg total) by mouth 2 (two) times daily.    Dispense:  540 tablet    Refill:  3     Cc: No ref. provider found,  Patient, No Pcp Per  Naomie Dean, MD  [facility] [address] [place], [place] [address]  Phone [phone] Fax [phone]

## 2022-11-13 NOTE — Telephone Encounter (Signed)
Schedule patinet in office with NP for follow up in 6-7 months. Prefer Megan or Amy thank you

## 2022-11-13 NOTE — Patient Instructions (Signed)
Continue diamox at current dose. Come to the neuro office in 6 months, prior to that see Dr. Dione Booze for follow up so we have note  Meds ordered this encounter  Medications   acetaZOLAMIDE (DIAMOX) 250 MG tablet    Sig: Take 3 tablets (750 mg total) by mouth 2 (two) times daily.    Dispense:  540 tablet    Refill:  3   Idiopathic Intracranial Hypertension  Idiopathic intracranial hypertension (IIH) is a condition that increases pressure around the brain. The fluid that surrounds the brain and spinal cord (cerebrospinal fluid, or CSF) increases and causes the pressure. Idiopathic means that the cause of this condition is not known. IIH affects the brain and spinal cord. If this condition is not treated, it can cause vision loss or blindness. What are the causes? The cause of this condition is not known. What increases the risk? The following factors may make you more likely to develop this condition: Being obese. Being a person who is female, between the ages of 53 and 42 years old, and who has not gone through menopause. Taking certain medicines, such as birth control, acne medicines, or steroids. What are the signs or symptoms? Symptoms of this condition include: Headaches. This is the most common symptom. Brief periods of total blindness. Double vision, blurred vision, or poor side (peripheral) vision. Pain in the shoulders or neck. Nausea and vomiting. A sound like rushing water or a pulsing sound within the ears (pulsatile tinnitus), or ringing in the ears. How is this diagnosed? This condition may be diagnosed based on: Your symptoms and medical history. Imaging tests of the brain, such as: CT scan. MRI. Magnetic resonance venogram (MRV) to check the veins. Diagnostic lumbar puncture. This is a procedure to remove and examine a sample of CSF. This procedure can determine whether your fluid pressure is too high. An eye exam to check for swelling or nerve damage in the eyes. How  is this treated? Treatment for this condition depends on the symptoms. The goal of treatment is to decrease the pressure around your brain. Common treatments include: Weight loss through healthy eating, salt restriction, and exercise, if you are overweight. Medicines to decrease the production of CSF and lower the pressure within your skull. Medicines to prevent or treat headaches. Other treatments may include: Surgery to place drains (shunts) in your brain to remove extra fluid. Lumbar puncture to remove extra CSF. Follow these instructions at home: If you are overweight or obese, work with your health care provider to lose weight. Take over-the-counter and prescription medicines only as told by your health care provider. Ask your health care provider if the medicine prescribed to you requires you to avoid driving or using machinery. Do not use any products that contain nicotine or tobacco. These products include cigarettes, chewing tobacco, and vaping devices, such as e-cigarettes. If you need help quitting, ask your health care provider. Keep all follow-up visits. Your health care provider will need to monitor you regularly. Contact a health care provider if: You have changes in your vision, such as: Double vision. Blurred vision. Poor peripheral vision. Get help right away if: You have any of the following symptoms and they get worse or do not get better: Headaches. Nausea. Vomiting. Sudden trouble seeing. This information is not intended to replace advice given to you by your health care provider. Make sure you discuss any questions you have with your health care provider. Document Revised: 08/01/2021 Document Reviewed: 07/11/2021 Elsevier Patient Education  2024 Elsevier Inc.  Acetazolamide Extended-Release Capsules What is this medication? ACETAZOLAMIDE (a set a ZOLE a mide) treats conditions with increased pressure of the eye, such as glaucoma. It works by decreasing the  amount of fluid in the eye, which helps lower eye pressure. It may also be used to prevent or treat symptoms of altitude sickness. It works by increasing the amount of oxygen in your body. It belongs to a group of medications called diuretics. This medicine may be used for other purposes; ask your health care provider or pharmacist if you have questions. COMMON BRAND NAME(S): Diamox, Diamox Sequels What should I tell my care team before I take this medication? They need to know if you have any of these conditions: Diabetes Glaucoma Kidney disease Liver disease Low adrenal gland function Lung or breathing disease An unusual or allergic reaction to acetazolamide, other medications, foods, dyes, or preservatives Pregnant or trying to get pregnant Breast-feeding How should I use this medication? Take this medication by mouth. Take it as directed on the prescription label at the same time every day. You can take it with or without food. If it upsets your stomach, take it with food. Keep taking it unless your care team tells you to stop. Talk to your care team about the use of this medication in children. While it may be prescribed for children as young as 12 years for selected conditions, precautions do apply. Overdosage: If you think you have taken too much of this medicine contact a poison control center or emergency room at once. NOTE: This medicine is only for you. Do not share this medicine with others. What if I miss a dose? If you miss a dose, take it as soon as you can. If it is almost time for your next dose, take only that dose. Do not take double or extra doses. What may interact with this medication? Do not take this medication with any of the following: Methazolamide This medication may also interact with the following: Aspirin and aspirin-like medications Cyclosporine Lithium Medication for diabetes Methenamine Other diuretics Phenytoin Primidone Quinidine Sodium  bicarbonate Stimulant medications, such as dextroamphetamine This list may not describe all possible interactions. Give your health care provider a list of all the medicines, herbs, non-prescription drugs, or dietary supplements you use. Also tell them if you smoke, drink alcohol, or use illegal drugs. Some items may interact with your medicine. What should I watch for while using this medication? Visit your care team for regular checks on your progress. Tell your care team if your symptoms do not start to get better or if they get worse. This medication may cause serious skin reactions. They can happen weeks to months after starting the medication. Contact your care team right away if you notice fevers or flu-like symptoms with a rash. The rash may be red or purple and then turn into blisters or peeling of the skin. Or, you might notice a red rash with swelling of the face, lips, or lymph nodes in your neck or under your arms. This medication may affect your coordination, reaction time, or judgment. Do not drive or operate machinery until you know how this medication affects you. Sit up or stand slowly to reduce the risk of dizzy or fainting spells. What side effects may I notice from receiving this medication? Side effects that you should report to your care team as soon as possible: Allergic reactions--skin rash, itching, hives, swelling of the face, lips, tongue, or throat Aplastic anemia--unusual  weakness or fatigue, dizziness, headache, trouble breathing, increased bleeding or bruising High acid level--trouble breathing, unusual weakness or fatigue, confusion, headache, fast or irregular heartbeat, nausea, vomiting High blood sugar (hyperglycemia)--increased thirst or amount of urine, unusual weakness or fatigue, blurry vision Infection--fever, chills, cough, or sore throat Kidney stones--blood in the urine, pain or trouble passing urine, pain in the lower back or sides Liver injury--right upper  belly pain, loss of appetite, nausea, light-colored stool, dark yellow or brown urine, yellowing skin or eyes, unusual weakness or fatigue Low blood sugar (hypoglycemia)--tremors or shaking, anxiety, sweating, cold or clammy skin, confusion, dizziness, rapid heartbeat Low potassium level--muscle pain or cramps, unusual weakness or fatigue, fast or irregular heartbeat, constipation Redness, blistering, peeling, or loosening of the skin, including inside the mouth Side effects that usually do not require medical attention (report to your care team if they continue or are bothersome): Change in taste Diarrhea Dizziness Fatigue Nausea Pain, tingling, or numbness in the hands or feet This list may not describe all possible side effects. Call your doctor for medical advice about side effects. You may report side effects to FDA at 1-800-FDA-1088. Where should I keep my medication? Keep out of the reach of children and pets. Store at room temperature between 20 and 25 degrees C (68 and 77 degrees F). Throw away any unused medication after the expiration date. NOTE: This sheet is a summary. It may not cover all possible information. If you have questions about this medicine, talk to your doctor, pharmacist, or health care provider.  2024 Elsevier/Gold Standard (2021-07-25 00:00:00)

## 2022-11-21 DIAGNOSIS — H4711 Papilledema associated with increased intracranial pressure: Secondary | ICD-10-CM | POA: Diagnosis not present

## 2022-11-21 DIAGNOSIS — H53022 Refractive amblyopia, left eye: Secondary | ICD-10-CM | POA: Diagnosis not present

## 2022-12-12 ENCOUNTER — Ambulatory Visit (INDEPENDENT_AMBULATORY_CARE_PROVIDER_SITE_OTHER): Payer: 59 | Admitting: Family Medicine

## 2022-12-18 ENCOUNTER — Other Ambulatory Visit (HOSPITAL_COMMUNITY): Payer: Self-pay

## 2022-12-18 ENCOUNTER — Telehealth: Payer: Self-pay

## 2022-12-18 NOTE — Telephone Encounter (Signed)
*  [facility]  Pharmacy Patient Advocate Encounter   Received notification from CoverMyMeds that prior authorization for Ubrelvy 100MG  tablets  is required/requested.   Insurance verification completed.   The patient is insured through [facility] .   Per test claim: PA required; PA started via CoverMyMeds. KEY [id number] . Waiting for clinical questions to populate.

## 2022-12-19 NOTE — Telephone Encounter (Signed)
Pharmacy Patient Advocate Encounter  Received notification from [employer] that Prior Authorization for Ubrelvy 100MG  tablets has been APPROVED from 12/18/2022 to 12/17/2023   PA #/Case ID/Reference #: PA Case ID #: [id number]

## 2022-12-26 ENCOUNTER — Encounter (INDEPENDENT_AMBULATORY_CARE_PROVIDER_SITE_OTHER): Payer: Self-pay | Admitting: Family Medicine

## 2022-12-26 ENCOUNTER — Ambulatory Visit (INDEPENDENT_AMBULATORY_CARE_PROVIDER_SITE_OTHER): Payer: 59 | Admitting: Family Medicine

## 2022-12-26 VITALS — BP 97/66 | HR 95 | Temp 98.2°F | Ht 65.0 in | Wt 275.0 lb

## 2022-12-26 DIAGNOSIS — E669 Obesity, unspecified: Secondary | ICD-10-CM

## 2022-12-26 DIAGNOSIS — R718 Other abnormality of red blood cells: Secondary | ICD-10-CM | POA: Diagnosis not present

## 2022-12-26 DIAGNOSIS — Z6841 Body Mass Index (BMI) 40.0 and over, adult: Secondary | ICD-10-CM

## 2022-12-26 DIAGNOSIS — E559 Vitamin D deficiency, unspecified: Secondary | ICD-10-CM | POA: Diagnosis not present

## 2022-12-26 MED ORDER — FUSION PLUS PO CAPS
1.0000 | ORAL_CAPSULE | Freq: Every day | ORAL | 0 refills | Status: DC
Start: 2022-12-26 — End: 2023-01-30

## 2022-12-26 MED ORDER — CHOLECALCIFEROL 1.25 MG (50000 UT) PO TABS
1.0000 | ORAL_TABLET | ORAL | 0 refills | Status: DC
Start: 2022-12-26 — End: 2023-03-04

## 2022-12-26 NOTE — Progress Notes (Signed)
Chief Complaint:   OBESITY Stephanie Cohen is here to discuss her progress with her obesity treatment plan along with follow-up of her obesity related diagnoses. Stephanie Cohen is on the Category 3 Plan and keeping a food journal and adhering to recommended goals of 1450-1600 calories and 90+ grams of protein and states she is following her eating plan approximately 75% of the time. Flynn states she is doing 0 minutes 0 times per week.  Today's visit was #: 15 Starting weight: 304 lbs Starting date: 10/03/2021 Today's weight: 275 lbs Today's date: 12/26/2022 Total lbs lost to date: 29 Total lbs lost since last in-office visit: 7  Interim History: Patient voices she just passed her NCLEX.  She got a job at [employer]- at [employer] and she already started. She hasn't been journaling consistently and hasn't been as on Category 3 as she was previously.  She hasn't been finishing her portions as much either.  Sometimes she realizes she tries to finish her plate and she isn't really hungry.  Over the next few weeks she is just planning on working.   Subjective:   1. Vitamin D deficiency Patient is on prescription vitamin D, and she notes fatigue.  Her last vitamin D level was of 26.9 in July.  2. Microcytosis Patient is on iron daily.  Her last MCV was 68 in June of this year and RBC elevated.  Assessment/Plan:   1. Vitamin D deficiency We will refill prescription vitamin D 50,000 IU once weekly for 90 days.  - Cholecalciferol 1.25 MG (50000 UT) TABS; Take 1 tablet by mouth once a week.  Dispense: 12 tablet; Refill: 0  2. Microcytosis Patient will continue iron daily with breakfast; she agreed to start Fusion Plus 1 capsule daily, with a 90-day supply.  - Iron-FA-B Cmp-C-Biot-Probiotic (FUSION PLUS) CAPS; Take 1 capsule by mouth daily.  Dispense: 90 capsule; Refill: 0  3. BMI 45.0-49.9, adult (HCC)  4. Obesity with starting BMI of 50.6 Stephanie Cohen is currently in the action stage of change. As such, her goal  is to continue with weight loss efforts. She has agreed to the Category 3 Plan and keeping a food journal and adhering to recommended goals of 1450-1600 calories and 90+ grams of protein daily.   Exercise goals: No exercise has been prescribed at this time.  Behavioral modification strategies: increasing lean protein intake, meal planning and cooking strategies, keeping healthy foods in the home, and planning for success.  [name] has agreed to follow-up with our clinic in 4 to 5 weeks. She was informed of the importance of frequent follow-up visits to maximize her success with intensive lifestyle modifications for her multiple health conditions.   Objective:   Blood pressure 97/66, pulse 95, temperature 98.2 F (36.8 C), height 5\' 5"  (1.651 m), weight 275 lb (124.7 kg), SpO2 96%. Body mass index is 45.76 kg/m.  General: Cooperative, alert, well developed, in no acute distress. HEENT: Conjunctivae and lids unremarkable. Cardiovascular: Regular rhythm.  Lungs: Normal work of breathing. Neurologic: No focal deficits.   Lab Results  Component Value Date   CREATININE 0.76 10/03/2022   BUN 7 10/03/2022   NA 138 10/03/2022   K 4.1 10/03/2022   CL 110 (H) 10/03/2022   CO2 CANCELED 10/03/2022   Lab Results  Component Value Date   ALT 11 10/03/2022   AST 18 10/03/2022   ALKPHOS 60 10/03/2022   BILITOT 0.3 10/03/2022   Lab Results  Component Value Date   HGBA1C 5.6 10/03/2022  HGBA1C 5.6 01/29/2022   HGBA1C 5.4 10/03/2021   Lab Results  Component Value Date   INSULIN 25.5 (H) 10/03/2022   INSULIN 17.2 01/29/2022   INSULIN 17.7 10/03/2021   Lab Results  Component Value Date   TSH 3.120 10/03/2021   Lab Results  Component Value Date   CHOL 135 10/03/2021   HDL 43 10/03/2021   LDLCALC 78 10/03/2021   TRIG 68 10/03/2021   Lab Results  Component Value Date   VD25OH 26.9 (L) 10/03/2022   VD25OH 25.2 (L) 01/29/2022   VD25OH 13.2 (L) 10/03/2021   Lab Results   Component Value Date   WBC 8.7 10/03/2022   HGB 13.1 10/03/2022   HCT 44.0 10/03/2022   MCV 68 (L) 10/03/2022   PLT 340 10/03/2022   Lab Results  Component Value Date   IRON 71 10/03/2022   TIBC 288 10/03/2022   FERRITIN 87 10/03/2022   Attestation Statements:   Reviewed by clinician on day of visit: allergies, medications, problem list, medical history, surgical history, family history, social history, and previous encounter notes.   I, Burt Knack, am acting as transcriptionist for Reuben Likes, MD.  I have reviewed the above documentation for accuracy and completeness, and I agree with the above. - Reuben Likes, MD

## 2023-01-20 ENCOUNTER — Other Ambulatory Visit (INDEPENDENT_AMBULATORY_CARE_PROVIDER_SITE_OTHER): Payer: Self-pay | Admitting: Family Medicine

## 2023-01-20 DIAGNOSIS — E88819 Insulin resistance, unspecified: Secondary | ICD-10-CM

## 2023-01-30 ENCOUNTER — Ambulatory Visit (INDEPENDENT_AMBULATORY_CARE_PROVIDER_SITE_OTHER): Payer: Commercial Managed Care - PPO | Admitting: Family Medicine

## 2023-01-30 ENCOUNTER — Encounter (INDEPENDENT_AMBULATORY_CARE_PROVIDER_SITE_OTHER): Payer: Self-pay | Admitting: Family Medicine

## 2023-01-30 DIAGNOSIS — D509 Iron deficiency anemia, unspecified: Secondary | ICD-10-CM | POA: Diagnosis not present

## 2023-01-30 DIAGNOSIS — E88819 Insulin resistance, unspecified: Secondary | ICD-10-CM

## 2023-01-30 DIAGNOSIS — E669 Obesity, unspecified: Secondary | ICD-10-CM | POA: Diagnosis not present

## 2023-01-30 DIAGNOSIS — Z6841 Body Mass Index (BMI) 40.0 and over, adult: Secondary | ICD-10-CM | POA: Diagnosis not present

## 2023-01-30 DIAGNOSIS — D508 Other iron deficiency anemias: Secondary | ICD-10-CM

## 2023-01-30 MED ORDER — FUSION PLUS PO CAPS: 1.0000 | ORAL_CAPSULE | Freq: Every day | ORAL | 0 refills | Status: DC

## 2023-01-30 MED ORDER — METFORMIN HCL 500 MG PO TABS: 500.0000 mg | ORAL_TABLET | Freq: Every day | ORAL | 0 refills | Status: DC

## 2023-01-30 NOTE — Assessment & Plan Note (Signed)
The cost of the fusion plus was too much on previous insurance.  She now switched insurance to the [place] provided by the hospital.  She denies fatigue.  She is still taking regular iron OTC.  Will try sending in Rx for Fusion Plus again to see if insurance will cover it.

## 2023-01-30 NOTE — Progress Notes (Signed)
SUBJECTIVE:  Chief Complaint: Obesity  Interim History: Since last appointment patient has been dealing with her dad ending up in the hospital in [facility].  She is trying to be supportive of him. He is now out of the icu and slightly more stable than he was.  She felt like she was on something and trying to do better for herself but then felt like she couldn't focus as much on herself. She hasn't been as consistent with her meal plan due to feeding her sister who is also spending time at the hospital.  She occasionally fall into skipping meals and eating only once a day.  She is trying to pull back from the hospital situation with her father and is doing quite a bit of overtime at work.  For Thanksgiving she thinks her family is cooking but isn't anticipating an elaborate celebration.   Stephanie Cohen is here to discuss her progress with her obesity treatment plan. She is on the keeping a food journal and adhering to recommended goals of 1450-1600 calories and 90+ grams of protein and states she is following her eating plan approximately 25 % of the time. She states she is walking.   OBJECTIVE: Visit Diagnoses: Problem List Items Addressed This Visit       Endocrine   Insulin resistance    Patient on metformin.  No GI side effects of medication mentioned.  She is trying to stop skipping so many meals and pay more attention to the content of the food she is taking in.  She is trying to focus on protein and be more consistent with food intake.       Relevant Medications   metFORMIN (GLUCOPHAGE) 500 MG tablet     Other   Iron deficiency anemia    The cost of the fusion plus was too much on previous insurance.  She now switched insurance to the [place] provided by the hospital.  She denies fatigue.  She is still taking regular iron OTC.  Will try sending in Rx for Fusion Plus again to see if insurance will cover it.      Relevant Medications   Iron-FA-B Cmp-C-Biot-Probiotic (FUSION PLUS) CAPS    Other Visit Diagnoses     Obesity with starting BMI of 50.6    -  Primary   Relevant Medications   metFORMIN (GLUCOPHAGE) 500 MG tablet   BMI 45.0-49.9, adult (HCC)       Relevant Medications   metFORMIN (GLUCOPHAGE) 500 MG tablet       Vitals Temp: 98.1 F (36.7 C) BP: 105/69 Pulse Rate: 72 SpO2: 99 %   Anthropometric Measurements Height: 5\' 5"  (1.651 m) Weight: 274 lb (124.3 kg) BMI (Calculated): 45.6 Weight at Last Visit: 282 lb Weight Lost Since Last Visit: 1 Weight Gained Since Last Visit: 0 Starting Weight: 304 lb Total Weight Loss (lbs): 30 lb (13.6 kg)   Body Composition  Body Fat %: 50.6 % Fat Mass (lbs): 138.8 lbs Muscle Mass (lbs): 128.6 lbs Total Body Water (lbs): 98 lbs Visceral Fat Rating : 14   Other Clinical Data Today's Visit #: 16 Starting Date: 10/03/21     ASSESSMENT AND PLAN:  Diet: Cielo is currently in the action stage of change. As such, her goal is to continue with weight loss efforts. She has agreed to Category 3 Plan and keeping a food journal and adhering to recommended goals of 1450-1600 calories and 90 or more grams protein.  Exercise: Richardine has been instructed that  some exercise is better than none for weight loss and overall health benefits.   Behavior Modification:  We discussed the following Behavioral Modification Strategies today: increasing lean protein intake, increasing vegetables, no skipping meals, meal planning and cooking strategies, and planning for success.   No follow-ups on file.Marland Kitchen She was informed of the importance of frequent follow up visits to maximize her success with intensive lifestyle modifications for her multiple health conditions.  Attestation Statements:   Reviewed by clinician on day of visit: allergies, medications, problem list, medical history, surgical history, family history, social history, and previous encounter notes.     Reuben Likes, MD

## 2023-01-30 NOTE — Assessment & Plan Note (Signed)
Patient on metformin.  No GI side effects of medication mentioned.  She is trying to stop skipping so many meals and pay more attention to the content of the food she is taking in.  She is trying to focus on protein and be more consistent with food intake.

## 2023-03-03 ENCOUNTER — Telehealth: Payer: Self-pay | Admitting: Obstetrics

## 2023-03-03 NOTE — Telephone Encounter (Signed)
22yo with IUD placed by Wyline Beady, NP around 1 year ago. Reports feelings string after removal of her tampon since she is on her cycle.  Denies severe pain, fever, or heavy bleeding.  Stopped tampon use and started using pads.  Advised calling office as soon as possible Monday morning for an exam as soon as possible to evaluation IUD string in vagina vs. Cervical migration of IUD that requires removal. Patient to seek urgent evaluation in the ED if she experiences fever >  100.4, N/V, severe pain not resolved after Ibuprofen/Tylenol, or bleeding soaking 1 pad/hr or large clots.  Patient expresses understanding with all questions answered.

## 2023-03-04 ENCOUNTER — Ambulatory Visit: Payer: Commercial Managed Care - PPO | Admitting: Nurse Practitioner

## 2023-03-04 ENCOUNTER — Encounter: Payer: Self-pay | Admitting: Nurse Practitioner

## 2023-03-04 VITALS — BP 92/70 | HR 66

## 2023-03-04 DIAGNOSIS — Z30431 Encounter for routine checking of intrauterine contraceptive device: Secondary | ICD-10-CM | POA: Diagnosis not present

## 2023-03-04 NOTE — Telephone Encounter (Signed)
Please call to schedule appt with patient for today or tomorrow.

## 2023-03-04 NOTE — Telephone Encounter (Signed)
Spoke with patient, OV scheduled for today at 1345, arriving at 1330. Patient aware this is a work in appt.   Routing to provider for final review. Patient is agreeable to disposition. Will close encounter.

## 2023-03-04 NOTE — Progress Notes (Signed)
   Acute Office Visit  Subjective:    Patient ID: Stephanie Cohen, female    DOB: 2000/06/04, 22 y.o.   MRN: [id number]   HPI 22 y.o. presents today for IUD check. Reports feeling strings coming out of vagina after removing tampon yesterday. LMP 03/02/23. Denies pain or change in bleeding pattern. Mirena inserted 10/2021. Does not check strings and has never noticed them before. Sexually active with same sex partner.   No LMP recorded. (Menstrual status: IUD).    Review of Systems  Constitutional: Negative.   Genitourinary: Negative.        Objective:    Physical Exam Constitutional:      Appearance: Normal appearance.  Genitourinary:    General: Normal vulva.     Vagina: Normal.     Cervix: Normal.     Uterus: Normal.      Comments: IUD strings visible ~3 cm    BP 92/70   Pulse 66   SpO2 98%  Wt Readings from Last 3 Encounters:  01/30/23 274 lb (124.3 kg)  12/26/22 275 lb (124.7 kg)  10/31/22 282 lb (127.9 kg)        Patient informed chaperone available to be present for breast and/or pelvic exam. Patient has requested no chaperone to be present. Patient has been advised what will be completed during breast and pelvic exam.   Assessment & Plan:   Problem List Items Addressed This Visit   None Visit Diagnoses       IUD check up    -  Primary      Plan: Normal IUD exam. Strings trimmed. Will monitor. Recommend checking strings routinely.   Return if symptoms worsen or fail to improve.    Olivia Mackie DNP, 2:47 PM 03/04/2023

## 2023-03-05 ENCOUNTER — Ambulatory Visit (INDEPENDENT_AMBULATORY_CARE_PROVIDER_SITE_OTHER): Payer: Commercial Managed Care - PPO | Admitting: Family Medicine

## 2023-03-30 ENCOUNTER — Other Ambulatory Visit (HOSPITAL_COMMUNITY): Payer: Self-pay

## 2023-03-30 ENCOUNTER — Other Ambulatory Visit (INDEPENDENT_AMBULATORY_CARE_PROVIDER_SITE_OTHER): Payer: Self-pay | Admitting: Family Medicine

## 2023-03-30 DIAGNOSIS — E559 Vitamin D deficiency, unspecified: Secondary | ICD-10-CM

## 2023-03-30 MED ORDER — ACETAZOLAMIDE 250 MG PO TABS
750.0000 mg | ORAL_TABLET | Freq: Two times a day (BID) | ORAL | 4 refills | Status: DC
  Filled 2023-03-30: qty 160, 27d supply, fill #0

## 2023-04-03 ENCOUNTER — Other Ambulatory Visit (HOSPITAL_COMMUNITY): Payer: Self-pay

## 2023-04-04 ENCOUNTER — Other Ambulatory Visit (HOSPITAL_COMMUNITY): Payer: Self-pay

## 2023-04-04 MED ORDER — VITAMIN D3 1.25 MG (50000 UT) PO CAPS
1.0000 | ORAL_CAPSULE | ORAL | 0 refills | Status: DC
  Filled 2023-04-04: qty 12, 84d supply, fill #0

## 2023-04-04 MED ORDER — ACETAZOLAMIDE 250 MG PO TABS
750.0000 mg | ORAL_TABLET | Freq: Two times a day (BID) | ORAL | 3 refills | Status: DC
  Filled 2023-04-04: qty 540, 90d supply, fill #0
  Filled 2023-04-09: qty 180, 30d supply, fill #0
  Filled 2023-04-21: qty 540, 90d supply, fill #0

## 2023-04-04 MED ORDER — FERROUS SULFATE 325 (65 FE) MG PO TABS
325.0000 mg | ORAL_TABLET | Freq: Every day | ORAL | 2 refills | Status: DC
  Filled 2023-04-04: qty 90, 90d supply, fill #0

## 2023-04-04 MED ORDER — FUSION PLUS PO CAPS
1.0000 | ORAL_CAPSULE | Freq: Every day | ORAL | 0 refills | Status: DC
  Filled 2023-04-04: qty 30, 30d supply, fill #0

## 2023-04-05 ENCOUNTER — Other Ambulatory Visit: Payer: Self-pay

## 2023-04-09 ENCOUNTER — Encounter (INDEPENDENT_AMBULATORY_CARE_PROVIDER_SITE_OTHER): Payer: Self-pay | Admitting: Family Medicine

## 2023-04-09 ENCOUNTER — Telehealth: Payer: 59 | Admitting: Neurology

## 2023-04-09 ENCOUNTER — Other Ambulatory Visit (HOSPITAL_COMMUNITY): Payer: Self-pay

## 2023-04-09 ENCOUNTER — Ambulatory Visit (INDEPENDENT_AMBULATORY_CARE_PROVIDER_SITE_OTHER): Payer: Commercial Managed Care - PPO | Admitting: Family Medicine

## 2023-04-09 VITALS — HR 56 | Temp 97.9°F | Ht 65.0 in | Wt 278.0 lb

## 2023-04-09 DIAGNOSIS — E559 Vitamin D deficiency, unspecified: Secondary | ICD-10-CM | POA: Diagnosis not present

## 2023-04-09 DIAGNOSIS — E669 Obesity, unspecified: Secondary | ICD-10-CM | POA: Diagnosis not present

## 2023-04-09 DIAGNOSIS — Z6841 Body Mass Index (BMI) 40.0 and over, adult: Secondary | ICD-10-CM | POA: Diagnosis not present

## 2023-04-09 DIAGNOSIS — R718 Other abnormality of red blood cells: Secondary | ICD-10-CM | POA: Diagnosis not present

## 2023-04-09 MED ORDER — VITAMIN D3 1.25 MG (50000 UT) PO CAPS
1.0000 | ORAL_CAPSULE | ORAL | 0 refills | Status: DC
  Filled 2023-04-09 – 2023-04-21 (×2): qty 12, 84d supply, fill #0

## 2023-04-09 NOTE — Progress Notes (Unsigned)
   SUBJECTIVE:  Chief Complaint: Obesity  Interim History: Last appointment was mid November.  She has been doing quite a bit of traveling and working.  She hasn't been taking much time off aside from working. She went to [place] to celebrate her best friends birthday.  She is dealing with different demands at work and dealing with swapping of schedule. Has been inconsistent with her food intake.  She isn't feeling the best and is having a hard time staying warm and feeling much more fatigued.  She has been swapping shifts frequently.   Ishi is here to discuss her progress with her obesity treatment plan. She is on the Category 3 Plan and keeping a food journal and adhering to recommended goals of 1450-1600 calories and 90 grams of protein and states she is following her eating plan approximately 50 % of the time. She states she is not exercising.   OBJECTIVE: Visit Diagnoses: Problem List Items Addressed This Visit   None   No data recorded Anthropometric Measurements Height: 5\' 5"  (1.651 m) Weight at Last Visit: 274 lbs Starting Weight: 304 lb   No data recorded Other Clinical Data Today's Visit #: 17 Starting Date: 10/03/21     ASSESSMENT AND PLAN:  Diet: Harlym {CHL AMB IS/IS NOT:210130109} currently in the action stage of change. As such, her goal is to {HWW Weight Loss Efforts:210964006}. She {HAS HAS WGN:56213} agreed to {HWW Weight Loss Plan:210964005}.  Exercise: Lyndia has been instructed {HWW Exercise:210964007} for weight loss and overall health benefits.   Behavior Modification:  We discussed the following Behavioral Modification Strategies today: {HWW Behavior Modification:210964008}. We discussed various medication options to help Blanca with her weight loss efforts and we both agreed to ***.  No follow-ups on file.Marland Kitchen She was informed of the importance of frequent follow up visits to maximize her success with intensive lifestyle modifications for her  multiple health conditions.  Attestation Statements:   Reviewed by clinician on day of visit: allergies, medications, problem list, medical history, surgical history, family history, social history, and previous encounter notes.   Time spent on visit including pre-visit chart review and post-visit care and charting was *** minutes.    Reuben Likes, MD

## 2023-04-10 DIAGNOSIS — R718 Other abnormality of red blood cells: Secondary | ICD-10-CM | POA: Insufficient documentation

## 2023-04-10 LAB — CBC WITH DIFFERENTIAL/PLATELET
Basophils Absolute: 0.1 10*3/uL (ref 0.0–0.2)
Basos: 1 %
EOS (ABSOLUTE): 0.2 10*3/uL (ref 0.0–0.4)
Eos: 2 %
Hemoglobin: 12.1 g/dL (ref 11.1–15.9)
Immature Grans (Abs): 0 10*3/uL (ref 0.0–0.1)
Immature Granulocytes: 0 %
Lymphocytes Absolute: 4.3 10*3/uL — ABNORMAL HIGH (ref 0.7–3.1)
Lymphs: 40 %
MCH: 20.4 pg — ABNORMAL LOW (ref 26.6–33.0)
MCHC: 28.7 g/dL — ABNORMAL LOW (ref 31.5–35.7)
MCV: 71 fL — ABNORMAL LOW (ref 79–97)
Monocytes Absolute: 0.7 10*3/uL (ref 0.1–0.9)
Monocytes: 7 %
Neutrophils Absolute: 5.4 10*3/uL (ref 1.4–7.0)
Neutrophils: 50 %
Platelets: 319 10*3/uL (ref 150–450)
RBC: 5.94 x10E6/uL — ABNORMAL HIGH (ref 3.77–5.28)
RDW: 17.5 % — ABNORMAL HIGH (ref 11.7–15.4)
WBC: 10.7 10*3/uL (ref 3.4–10.8)

## 2023-04-10 LAB — ANEMIA PANEL
Ferritin: 97 ng/mL (ref 15–150)
Folate, Hemolysate: 361 ng/mL
Folate, RBC: 855 ng/mL (ref >498)
Hematocrit: 42.2 % (ref 34.0–46.6)
Iron Saturation: 21 % (ref 15–55)
Iron: 53 ug/dL (ref 27–159)
Retic Ct Pct: 0.9 % (ref 0.6–2.6)
Total Iron Binding Capacity: 252 ug/dL (ref 250–450)
UIBC: 199 ug/dL (ref 131–425)
Vitamin B-12: 344 pg/mL (ref 232–1245)

## 2023-04-10 LAB — VITAMIN D 25 HYDROXY (VIT D DEFICIENCY, FRACTURES): Vit D, 25-Hydroxy: 15.7 ng/mL — ABNORMAL LOW (ref 30.0–100.0)

## 2023-04-10 NOTE — Assessment & Plan Note (Signed)
Patient has previously undergone iron infusions and evaluation by hematology oncology for her microcytosis.  Patient is reporting significant increase in fatigue, feelings of cold intolerance, difficulty being warm, and food cravings.  She is wondering if she is iron deficient again.  She has been taking her fusion plus as prescribed.  After reviewing previous notes from hematology oncology patient was found to have thalassemia trait which could be attributing to her microcytosis.  Will draw a CBC, anemia panel, and reticulocyte count today.  Pending results patient may need to reach out to hematology for further management.

## 2023-04-10 NOTE — Assessment & Plan Note (Signed)
Patient is reporting significantly more fatigue over the last few weeks.  She is not sure if this is due to iron deficiency, vitamin D deficiency, seasonal disorder, stress from holidays.  She needs a refill of her prescription strength vitamin D and we will do a repeat vitamin D level to see if she needs higher dose supplementation or more frequent supplementation then just weekly.

## 2023-04-16 ENCOUNTER — Other Ambulatory Visit (HOSPITAL_COMMUNITY): Payer: Self-pay

## 2023-04-22 ENCOUNTER — Other Ambulatory Visit (HOSPITAL_COMMUNITY): Payer: Self-pay

## 2023-04-26 ENCOUNTER — Other Ambulatory Visit (HOSPITAL_COMMUNITY): Payer: Self-pay

## 2023-04-26 MED ORDER — ALBUTEROL SULFATE HFA 108 (90 BASE) MCG/ACT IN AERS
2.0000 | INHALATION_SPRAY | Freq: Four times a day (QID) | RESPIRATORY_TRACT | 0 refills | Status: AC | PRN
  Filled 2023-04-26: qty 6.7, 25d supply, fill #0

## 2023-04-26 MED ORDER — LIDOCAINE VISCOUS HCL 2 % MT SOLN
10.0000 mL | OROMUCOSAL | 0 refills | Status: DC | PRN
  Filled 2023-04-26: qty 100, 2d supply, fill #0

## 2023-04-29 ENCOUNTER — Ambulatory Visit (INDEPENDENT_AMBULATORY_CARE_PROVIDER_SITE_OTHER): Payer: Commercial Managed Care - PPO | Admitting: Family Medicine

## 2023-04-29 ENCOUNTER — Other Ambulatory Visit (INDEPENDENT_AMBULATORY_CARE_PROVIDER_SITE_OTHER): Payer: Self-pay | Admitting: Family Medicine

## 2023-04-29 ENCOUNTER — Telehealth (INDEPENDENT_AMBULATORY_CARE_PROVIDER_SITE_OTHER): Payer: Self-pay | Admitting: Family Medicine

## 2023-04-29 DIAGNOSIS — E88819 Insulin resistance, unspecified: Secondary | ICD-10-CM

## 2023-04-29 NOTE — Telephone Encounter (Signed)
 pt needs refill on Metformin 

## 2023-05-09 ENCOUNTER — Other Ambulatory Visit (HOSPITAL_COMMUNITY): Payer: Self-pay

## 2023-05-09 ENCOUNTER — Encounter (HOSPITAL_COMMUNITY): Payer: Self-pay

## 2023-05-09 ENCOUNTER — Other Ambulatory Visit (INDEPENDENT_AMBULATORY_CARE_PROVIDER_SITE_OTHER): Payer: Self-pay | Admitting: Family Medicine

## 2023-05-09 ENCOUNTER — Encounter (INDEPENDENT_AMBULATORY_CARE_PROVIDER_SITE_OTHER): Payer: Self-pay | Admitting: Family Medicine

## 2023-05-09 ENCOUNTER — Other Ambulatory Visit (INDEPENDENT_AMBULATORY_CARE_PROVIDER_SITE_OTHER): Payer: Self-pay

## 2023-05-09 DIAGNOSIS — E88819 Insulin resistance, unspecified: Secondary | ICD-10-CM

## 2023-05-09 DIAGNOSIS — R718 Other abnormality of red blood cells: Secondary | ICD-10-CM

## 2023-05-09 MED ORDER — METFORMIN HCL 500 MG PO TABS
500.0000 mg | ORAL_TABLET | Freq: Every day | ORAL | 0 refills | Status: DC
Start: 2023-05-09 — End: 2023-05-27
  Filled 2023-05-15: qty 30, 30d supply, fill #0

## 2023-05-09 MED ORDER — FUSION PLUS PO CAPS
1.0000 | ORAL_CAPSULE | Freq: Every day | ORAL | 0 refills | Status: DC
  Filled 2023-05-15: qty 30, 30d supply, fill #0

## 2023-05-13 ENCOUNTER — Other Ambulatory Visit (INDEPENDENT_AMBULATORY_CARE_PROVIDER_SITE_OTHER): Payer: Self-pay | Admitting: Family Medicine

## 2023-05-13 DIAGNOSIS — R718 Other abnormality of red blood cells: Secondary | ICD-10-CM

## 2023-05-13 DIAGNOSIS — E88819 Insulin resistance, unspecified: Secondary | ICD-10-CM

## 2023-05-14 ENCOUNTER — Other Ambulatory Visit (HOSPITAL_BASED_OUTPATIENT_CLINIC_OR_DEPARTMENT_OTHER): Payer: Self-pay

## 2023-05-14 ENCOUNTER — Other Ambulatory Visit (HOSPITAL_COMMUNITY): Payer: Self-pay

## 2023-05-14 ENCOUNTER — Other Ambulatory Visit: Payer: Self-pay

## 2023-05-15 ENCOUNTER — Other Ambulatory Visit (HOSPITAL_COMMUNITY): Payer: Self-pay

## 2023-05-15 MED ORDER — METFORMIN HCL 500 MG PO TABS
500.0000 mg | ORAL_TABLET | Freq: Every day | ORAL | 0 refills | Status: DC
  Filled 2023-05-15: qty 30, 30d supply, fill #0

## 2023-05-21 ENCOUNTER — Ambulatory Visit (INDEPENDENT_AMBULATORY_CARE_PROVIDER_SITE_OTHER): Admitting: Radiology

## 2023-05-21 ENCOUNTER — Other Ambulatory Visit (HOSPITAL_COMMUNITY): Payer: Self-pay

## 2023-05-21 ENCOUNTER — Encounter: Payer: Self-pay | Admitting: Radiology

## 2023-05-21 VITALS — BP 114/72 | HR 81 | Wt 280.2 lb

## 2023-05-21 DIAGNOSIS — B3731 Acute candidiasis of vulva and vagina: Secondary | ICD-10-CM | POA: Diagnosis not present

## 2023-05-21 DIAGNOSIS — N898 Other specified noninflammatory disorders of vagina: Secondary | ICD-10-CM

## 2023-05-21 LAB — WET PREP FOR TRICH, YEAST, CLUE

## 2023-05-21 MED ORDER — FLUCONAZOLE 150 MG PO TABS
150.0000 mg | ORAL_TABLET | ORAL | 0 refills | Status: DC
  Filled 2023-05-21: qty 2, 6d supply, fill #0

## 2023-05-21 NOTE — Progress Notes (Signed)
      Subjective: Stephanie Cohen is a 23 y.o. female who complains of vaginal irritation, slight discharge x's 1, vaginal dryness, vulva painful with wiping, no odor, no dysuria, no urgency, no frequency. No new partners, did use a new silicone sex toy recently.    Review of Systems  All other systems reviewed and are negative.   Past Medical History:  Diagnosis Date   ADHD    Asthma    Back pain    IIH (idiopathic intracranial hypertension)    Joint pain    PCOS (polycystic ovarian syndrome)       Objective:  Today's Vitals   05/21/23 1344  BP: 114/72  Pulse: 81  SpO2: 99%  Weight: 280 lb 3.2 oz (127.1 kg)   Body mass index is 46.63 kg/m.   Physical Exam Vitals and nursing note reviewed. Exam conducted with a chaperone present.  Constitutional:      Appearance: Normal appearance. She is well-developed.  Pulmonary:     Effort: Pulmonary effort is normal.  Abdominal:     General: Abdomen is flat.     Palpations: Abdomen is soft.  Genitourinary:    General: Normal vulva.     Vagina: Vaginal discharge present. No erythema, bleeding or lesions.     Cervix: Normal. No discharge, friability, lesion or erythema.     Uterus: Normal.      Adnexa: Right adnexa normal and left adnexa normal.  Neurological:     Mental Status: She is alert.  Psychiatric:        Mood and Affect: Mood normal.        Thought Content: Thought content normal.        Judgment: Judgment normal.     Microscopic wet-mount exam shows hyphae.   Raynelle Fanning, CMA present for exam  Assessment:/Plan:  1. Vaginal irritation (Primary) - WET PREP FOR TRICH, YEAST, CLUE  2. Yeast vaginitis - fluconazole (DIFLUCAN) 150 MG tablet; Take 1 tablet (150 mg total) by mouth every 3 (three) days.  Dispense: 2 tablet; Refill: 0     Avoid intercourse until symptoms are resolved. Safe sex encouraged. Avoid the use of soaps or perfumed products in the peri area. Avoid tub baths and sitting in sweaty or  wet clothing for prolonged periods of time.    Jodilyn Giese B, NP 1:55 PM

## 2023-05-27 ENCOUNTER — Encounter (INDEPENDENT_AMBULATORY_CARE_PROVIDER_SITE_OTHER): Payer: Self-pay | Admitting: Family Medicine

## 2023-05-27 ENCOUNTER — Ambulatory Visit (INDEPENDENT_AMBULATORY_CARE_PROVIDER_SITE_OTHER): Payer: Commercial Managed Care - PPO | Admitting: Family Medicine

## 2023-05-27 ENCOUNTER — Other Ambulatory Visit (HOSPITAL_COMMUNITY): Payer: Self-pay

## 2023-05-27 VITALS — BP 101/66 | HR 69 | Temp 98.3°F | Ht 65.0 in | Wt 278.0 lb

## 2023-05-27 DIAGNOSIS — Z6841 Body Mass Index (BMI) 40.0 and over, adult: Secondary | ICD-10-CM

## 2023-05-27 DIAGNOSIS — D509 Iron deficiency anemia, unspecified: Secondary | ICD-10-CM

## 2023-05-27 DIAGNOSIS — D508 Other iron deficiency anemias: Secondary | ICD-10-CM

## 2023-05-27 DIAGNOSIS — R718 Other abnormality of red blood cells: Secondary | ICD-10-CM

## 2023-05-27 DIAGNOSIS — E66813 Obesity, class 3: Secondary | ICD-10-CM

## 2023-05-27 DIAGNOSIS — E88819 Insulin resistance, unspecified: Secondary | ICD-10-CM

## 2023-05-27 MED ORDER — METFORMIN HCL 500 MG PO TABS
500.0000 mg | ORAL_TABLET | Freq: Every day | ORAL | 0 refills | Status: DC
  Filled 2023-05-27 – 2023-06-10 (×2): qty 90, 90d supply, fill #0

## 2023-05-27 MED ORDER — FUSION PLUS PO CAPS
1.0000 | ORAL_CAPSULE | Freq: Every day | ORAL | 0 refills | Status: DC
  Filled 2023-05-27: qty 90, 90d supply, fill #0
  Filled 2023-06-10 (×2): qty 30, 30d supply, fill #0

## 2023-05-27 NOTE — Progress Notes (Signed)
 SUBJECTIVE:  Chief Complaint: Obesity  Interim History: Since last appointment patient had Covid and influenza.  She is still having some residual cough.  Other than that she feels relatively well.  She got off metformin due to issues with getting the medication.  She is trying to get back to a place where she is more in control of everything.  She has started doing 3 twelve hour shifts which gives her more time off.  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  She is trying to figure out what she wants to do food wise for the next few weeks.  Teva is here to discuss her progress with her obesity treatment plan. She is on the keeping a food journal and adhering to recommended goals of 1450-1600 calories and 95 grams of protein and states she is following her eating plan approximately 50% of the time. She states she is not exercising.  OBJECTIVE: Visit Diagnoses: Problem List Items Addressed This Visit       Endocrine   Insulin resistance   Patient has had some issues getting her prescription recently.  She is feeling well enough to take it now and still be able to get all the food in.  Needs refill today.      Relevant Medications   metFORMIN (GLUCOPHAGE) 500 MG tablet     Other   Class 3 severe obesity with serious comorbidity and body mass index (BMI) of 50.0 to 59.9 in adult [facility])   See anthropometric data.  Continue with journaling.  Patient to start food log or journaling meal plan.  The initial goal will be to habitually log or journal for at least 4 days a week.  The expectation it that patient may not initially meet calorie or protein goals as the nturitional understanding of food intake is begun.  We discussed the 10:1 ratio when reading a food label.  Patient agrees to keep a food log either electronically or on paper and bring to the next appointment to be able to dissect and discuss it with provider.        Relevant Medications   metFORMIN  (GLUCOPHAGE) 500 MG tablet   Iron deficiency anemia - Primary   Doing well on fusion plus for iron deficiency- no GI side effects mentioned.  Refill fusion plus at this time- will repeat labs in July.      Relevant Medications   Iron-FA-B Cmp-C-Biot-Probiotic (FUSION PLUS) CAPS   Microcytosis   Relevant Medications   Iron-FA-B Cmp-C-Biot-Probiotic (FUSION PLUS) CAPS    Vitals Temp: 98.3 F (36.8 C) BP: 101/66 Pulse Rate: 69 SpO2: 97 %   Anthropometric Measurements Height: 5\' 5"  (1.651 m) Weight: 278 lb (126.1 kg) BMI (Calculated): 46.26 Weight at Last Visit: 278 lb Weight Lost Since Last Visit: 0 Weight Gained Since Last Visit: 0 Starting Weight: 304 lb Total Weight Loss (lbs): 26 lb (11.8 kg)   Body Composition  Body Fat %: 51.7 % Fat Mass (lbs): 144 lbs Muscle Mass (lbs): 127.8 lbs Total Body Water (lbs): 100.4 lbs Visceral Fat Rating : 14   Other Clinical Data Today's Visit #: 18 Starting Date: 10/03/21 Comments: 1450-1600/95     ASSESSMENT AND PLAN:  Diet: Faye is currently in the action stage of change. As such, her goal is to continue with weight loss efforts and has agreed to keeping a food journal and adhering to recommended goals of 1450-1600 calories and 95 or more grams  protein daily.   Exercise:  For substantial health benefits, adults should do at least 150 minutes (2 hours and 30 minutes) a week of moderate-intensity, or 75 minutes (1 hour and 15 minutes) a week of vigorous-intensity aerobic physical activity, or an equivalent combination of moderate- and vigorous-intensity aerobic activity. Aerobic activity should be performed in episodes of at least 10 minutes, and preferably, it should be spread throughout the week.  Behavior Modification:  We discussed the following Behavioral Modification Strategies today: increasing lean protein intake, increasing vegetables, meal planning and cooking strategies, and planning for success.   No  follow-ups on file.Marland Kitchen She was informed of the importance of frequent follow up visits to maximize her success with intensive lifestyle modifications for her multiple health conditions.  Attestation Statements:   Reviewed by clinician on day of visit: allergies, medications, problem list, medical history, surgical history, family history, social history, and previous encounter notes.     Reuben Likes, MD

## 2023-05-27 NOTE — Assessment & Plan Note (Signed)
 See anthropometric data.  Continue with journaling.  Patient to start food log or journaling meal plan.  The initial goal will be to habitually log or journal for at least 4 days a week.  The expectation it that patient may not initially meet calorie or protein goals as the nturitional understanding of food intake is begun.  We discussed the 10:1 ratio when reading a food label.  Patient agrees to keep a food log either electronically or on paper and bring to the next appointment to be able to dissect and discuss it with provider.

## 2023-05-27 NOTE — Assessment & Plan Note (Signed)
 Doing well on fusion plus for iron deficiency- no GI side effects mentioned.  Refill fusion plus at this time- will repeat labs in July.

## 2023-05-27 NOTE — Assessment & Plan Note (Signed)
 Patient has had some issues getting her prescription recently.  She is feeling well enough to take it now and still be able to get all the food in.  Needs refill today.

## 2023-06-10 ENCOUNTER — Other Ambulatory Visit (HOSPITAL_COMMUNITY): Payer: Self-pay

## 2023-06-10 NOTE — Progress Notes (Unsigned)
 No chief complaint on file.   HISTORY OF PRESENT ILLNESS:  06/10/23 ALL:  Stephanie Cohen is a 23 y.o. female here today for follow up for IIH. She was last seen by Dr Lucia Gaskins via MyChart 10/2022.    HISTORY (copied from Dr Trevor Mace previous note)  11/13/2022: Patient is here for follow-up of IIH.  The last note I have from Dr. Alden Hipp are from February 26, 2022 for papilledema was doing "much better" confirmed with opening pressure of 36 on LP otherwise unrevealing MRI MRV.  His recommendations were to continue acetazolamide at present doses which were 1500 mg daily and continue weight loss.  There was post to follow-up with Lucretia in 6 months for an extended exam including HVF and OCT.  She had severe edema which was resolved bilaterally, visual acuity OD was 2020 and OS was 20/30, at last appointment she had lost about 20 pounds.  At last visit she had no headaches, no changes, no significant symptoms.   All has been OK. She has occassional migraines when stressed. Unknown stress. Once a month, she is trying to decide if the Constance Haw is effective, she leans more towards the excedrin and this is ok as long as she doesn't take more than one time a month. No vision changes. She sae Dr. Dione Booze in January and things wree much better. She needs to see Dr. Dione Booze, I encouraged her to do so. No vision changes, no frequent headaches, occ migraine. She is still on 750mg  2x a day. Hasn't lost any weight, doing well, will keep her on it until see by Dr. 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    03/08/2022, patient is here for follow-up of IIH: I reviewed Dr. Laruth Bouchard note he just saw her in ophthalmology on February 26, 2022 for her papilledema which she said is worse OS greater than OD but "MUCH BETTER" confirmed with opening pressure of 36 an LP otherwise unrevealing MRI MRV.  His recommendations were to continue acetazolamide at present dose which was 1500  mg daily and continue weight loss, they will follow-up with him in 6 months for an extended exam including HVF and OCT.  She had severe edema which was resolved bilaterally.  Visual acuity OD was 2020 and OS was 20/30.  08/2021 she was 308 and now 288. If get to 250 I would decrease diamox. Discuss with primary care about wegovy.    Sent not to Dr. Lawson Radar at weight loss center to see if wegovy or zepbound might help. I'd like her to come into the office in August or can be video if she sees Dr. Dione Booze.    No headaches, no issues, no vision changes, no side effects to the medications. Appt with Dr. Dione Booze is in June.    Patient complains of symptoms per HPI as well as the following symptoms: IIH. Pertinent negatives and positives per HPI. All others negative   11/21/2021: Patient with IDIOPATHIC INTRACRANIAL HYPERTENSION, ran out of her medications. Sge was titrated back onto 500mg  twice daily of acetazolamide. No blackening os vision, no new vision problems, no wooshing of the ears, papilledema has improved today, still has npt gone to see dr Dione Booze she rescheduled her appointment with Dr. Dione Booze and I encouraged her to go. She is on 750mg  bid diamox. She has other types of headaches, pulsating pounding throbbing, light and sound sensitivity, moderate, a little queasy, movement makes it worse, different than the  pressure. She has tried rizatriptan, sumatriptan, OTC meds, nothing helped acutely. No other focal neurologic deficits, associated symptoms, inciting events or modifiable factors.4 migraine days a months, < 10 total headache days a month. Needs to call Dr. Dione Booze for repeat eye exam.   Patient complains of symptoms per HPI as well as the following symptoms: migraines . Pertinent negatives and positives per HPI. All others negative   HPI:  Stephanie Cohen is a 23 y.o. female here as requested by No ref. provider found for IDIOPATHIC INTRACRANIAL HYPERTENSION. Sent to the emergency room from Dr. Dione Booze  (Ophthalmology). She was on diamox and ran out. 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    She is feeling better. She has reduced appetite. She started having episodes of vision loss, wooshing in the ears, pressure headache. She has improved, no complete vision loss. She is feeling better now, headaches improved, swishing reduced, no more vison changes, we discussed IDIOPATHIC INTRACRANIAL HYPERTENSION and treatment, risk of vision loss, they were very educated on the syndrome, discussed weight loss. Mother is her an provides much information. No other focal neurologic deficits, associated symptoms, inciting events or modifiable factors.   REVIEW OF SYSTEMS: Out of a complete 14 system review of symptoms, the patient complains only of the following symptoms, and all other reviewed systems are negative.   ALLERGIES: Allergies  Allergen Reactions   Antihistamines, Loratadine-Type Hives    Alavert      HOME MEDICATIONS: Outpatient Medications Prior to Visit  Medication Sig Dispense Refill   acetaZOLAMIDE (DIAMOX) 250 MG tablet Take 3 tablets (750 mg total) by mouth 2 (two) times daily. 540 tablet 3   acetaZOLAMIDE (DIAMOX) 250 MG tablet Take 3 tablets (750 mg total) by mouth 2 (two) times daily. 360 tablet 4   acetaZOLAMIDE (DIAMOX) 250 MG tablet Take 3 tablets (750 mg total) by mouth 2 (two) times daily. 540 tablet 3   albuterol (VENTOLIN HFA) 108 (90 Base) MCG/ACT inhaler Inhale 2 puffs into the lungs every 6 (six) hours as needed for wheezing. 6.7 g 0   cetirizine (ZYRTEC) 10 MG tablet Take 10 mg by mouth 2 (two) times daily.     Cholecalciferol (VITAMIN D3) 1.25 MG (50000 UT) CAPS Take 1 capsule (1.25 mg total) by mouth once a week. 12 capsule 0   ferrous sulfate 325 (65 FE) MG  tablet Take 1 tablet (325 mg total) by mouth daily with breakfast. 90 tablet 2   Iron-FA-B Cmp-C-Biot-Probiotic (FUSION PLUS) CAPS Take 1 capsule by mouth daily. 90 capsule 0   lidocaine (XYLOCAINE) 2 % solution Gargle and spit 10 mLs in the mouth or throat every 4-6 (four to six) hours as needed for sore throat. Max of 8 doses per day. 100 mL 0   metFORMIN (GLUCOPHAGE) 500 MG tablet Take 1 tablet (500 mg total) by mouth daily with breakfast. 90 tablet 0   Ubrogepant (UBRELVY) 100 MG TABS Take 100 mg by mouth every 2 (two) hours as needed. Maximum 200mg  a day. 16 tablet 11   Facility-Administered Medications Prior to Visit  Medication Dose Route Frequency Provider Last Rate Last Admin   levonorgestrel (MIRENA) 20 MCG/DAY IUD 1 each  1 each Intrauterine Once Olivia Mackie, NP         PAST MEDICAL HISTORY:  Past Medical History:  Diagnosis Date   ADHD    Asthma    Back pain    IIH (idiopathic intracranial hypertension)    Joint pain    PCOS (polycystic ovarian syndrome)      PAST SURGICAL HISTORY: Past Surgical History:  Procedure Laterality Date   NO PAST SURGERIES       FAMILY HISTORY: Family History  Problem Relation Age of Onset   Obesity Mother    Obesity Father    Asthma Father    Diabetes Father    Crohn's disease Maternal Grandmother    Diabetes Paternal Grandmother    Stroke Neg Hx      SOCIAL HISTORY: Social History   Socioeconomic History   Marital status: Single    Spouse name: Not on file   Number of children: Not on file   Years of education: Not on file   Highest education level: Not on file  Occupational History   Occupation: Consulting civil engineer, CNA, [employer]  Tobacco Use   Smoking status: Never   Smokeless tobacco: Never  Vaping Use   Vaping status: Never Used  Substance and Sexual Activity   Alcohol use: Not Currently    Comment: occasional/ social   Drug use: Never   Sexual activity: Not Currently    Partners: Male, Female    Birth  control/protection: I.U.D.    Comment: Mirena insertion on 10/30/2021  Other Topics Concern   Not on file  Social History Narrative   Caffiene occasional Rare.     Work: ET/ Lawyer at [employer]   Social Drivers of [employer] Strain: Not on [employer] Insecurity: Not on file  Transportation Needs: Not on file  Physical Activity: Not on file  Stress: Not on file  Social Connections: Not on file  Intimate Partner Violence: Not on file     PHYSICAL EXAM  There were no vitals filed for this visit. There is no height or weight on file to calculate BMI.  Generalized: Well developed, in no acute distress  Cardiology: normal rate and rhythm, no murmur auscultated  Respiratory: clear to auscultation bilaterally    Neurological examination  Mentation: Alert oriented to time, place, history taking. Follows all commands speech and language fluent Cranial nerve II-XII: Pupils were equal round reactive to light. Extraocular movements were full, visual field were full on confrontational test. Facial sensation and strength were normal. Uvula tongue midline. Head turning and shoulder shrug  were normal and symmetric. Motor: The motor testing reveals 5 over 5 strength of all 4 extremities. Good symmetric motor tone is noted throughout.  Sensory: Sensory testing is intact to soft touch on all 4 extremities. No evidence of extinction is noted.  Coordination: Cerebellar testing reveals good finger-nose-finger and heel-to-shin bilaterally.  Gait and station: Gait is normal. Tandem gait is normal. Romberg is negative. No drift is seen.  Reflexes: Deep tendon reflexes are symmetric and normal bilaterally.    DIAGNOSTIC DATA (LABS, IMAGING, TESTING) - I reviewed patient records, labs, notes, testing and imaging myself where available.  Lab Results  Component Value Date   WBC 10.7 04/09/2023   HGB 12.1 04/09/2023   HCT 42.2 04/09/2023   MCV 71 (L) 04/09/2023   PLT 319 04/09/2023       Component Value Date/Time   NA 138 10/03/2022 1145   K 4.1 10/03/2022 1145   CL 110 (H) 10/03/2022 1145   CO2 CANCELED 10/03/2022 1145   GLUCOSE 72 10/03/2022  1145   GLUCOSE 97 02/12/2022 1431   BUN 7 10/03/2022 1145   CREATININE 0.76 10/03/2022 1145   CREATININE 0.90 02/12/2022 1431   CALCIUM 8.7 10/03/2022 1145   PROT 7.1 10/03/2022 1145   ALBUMIN 4.1 10/03/2022 1145   AST 18 10/03/2022 1145   AST 13 (L) 02/12/2022 1431   ALT 11 10/03/2022 1145   ALT 10 02/12/2022 1431   ALKPHOS 60 10/03/2022 1145   BILITOT 0.3 10/03/2022 1145   BILITOT 0.4 02/12/2022 1431   GFRNONAA >60 02/12/2022 1431   Lab Results  Component Value Date   CHOL 135 10/03/2021   HDL 43 10/03/2021   LDLCALC 78 10/03/2021   TRIG 68 10/03/2021   Lab Results  Component Value Date   HGBA1C 5.6 10/03/2022   Lab Results  Component Value Date   VITAMINB12 344 04/09/2023   Lab Results  Component Value Date   TSH 3.120 10/03/2021        No data to display               No data to display           ASSESSMENT AND PLAN  23 y.o. year old female  has a past medical history of ADHD, Asthma, Back pain, IIH (idiopathic intracranial hypertension), Joint pain, and PCOS (polycystic ovarian syndrome). here with    No diagnosis found.  Melven Sartorius ***.  Healthy lifestyle habits encouraged. *** will follow up with PCP as directed. *** will return to see me in ***, sooner if needed. *** verbalizes understanding and agreement with this plan.   No orders of the defined types were placed in this encounter.    No orders of the defined types were placed in this encounter.    Shawnie Dapper, MSN, FNP-C 06/10/2023, 1:04 PM  [facility] [address] [place], [place] [address] [phone]

## 2023-06-10 NOTE — Patient Instructions (Incomplete)
 Below is our plan:  We will continue acetazolamide 750mg  twice daily. May help with weight loss efforts as well. Continue Excedrin or Ubrelvy as needed. Do not take these consistently. Do do not get pregnant while taking acetazolamide. Continue close follow up with [facility].   Please make sure you are staying well hydrated. I recommend 50-60 ounces daily. Well balanced diet and regular exercise encouraged. Consistent sleep schedule with 6-8 hours recommended.   Please continue follow up with care team as directed.   Follow up with me in 1 year   You may receive a survey regarding today's visit. I encourage you to leave honest feed back as I do use this information to improve patient care. Thank you for seeing me today!   GENERAL HEADACHE INFORMATION:   Natural supplements: Magnesium Oxide or Magnesium Glycinate 500 mg at bed (up to 800 mg daily) Coenzyme Q10 300 mg in AM Vitamin B2- 200 mg twice a day   Add 1 supplement at a time since even natural supplements can have undesirable side effects. You can sometimes buy supplements cheaper (especially Coenzyme Q10) at www.WebmailGuide.co.za or at [facility]  Migraine with aura: There is increased risk for stroke in women with migraine with aura and a contraindication for the combined contraceptive pill for use by women who have migraine with aura. The risk for women with migraine without aura is lower. However other risk factors like smoking are far more likely to increase stroke risk than migraine. There is a recommendation for no smoking and for the use of OCPs without estrogen such as progestogen only pills particularly for women with migraine with aura.Marland Kitchen People who have migraine headaches with auras may be 3 times more likely to have a stroke caused by a blood clot, compared to migraine patients who don't see auras. Women who take hormone-replacement therapy may be 30 percent more likely to suffer a clot-based stroke than women not  taking medication containing estrogen. Other risk factors like smoking and high blood pressure may be  much more important.    Vitamins and herbs that show potential:   Magnesium: Magnesium (250 mg twice a day or 500 mg at bed) has a relaxant effect on smooth muscles such as blood vessels. Individuals suffering from frequent or daily headache usually have low magnesium levels which can be increase with daily supplementation of 400-750 mg. Three trials found 40-90% average headache reduction  when used as a preventative. Magnesium may help with headaches are aura, the best evidence for magnesium is for migraine with aura is its thought to stop the cortical spreading depression we believe is the pathophysiology of migraine aura.Magnesium also demonstrated the benefit in menstrually related migraine.  Magnesium is part of the messenger system in the serotonin cascade and it is a good muscle relaxant.  It is also useful for constipation which can be a side effect of other medications used to treat migraine. Good sources include nuts, whole grains, and tomatoes. Side Effects: loose stool/diarrhea  Riboflavin (vitamin B 2) 200 mg twice a day. This vitamin assists nerve cells in the production of ATP a principal energy storing molecule.  It is necessary for many chemical reactions in the body.  There have been at least 3 clinical trials of riboflavin using 400 mg per day all of which suggested that migraine frequency can be decreased.  All 3 trials showed significant improvement in over half of migraine sufferers.  The supplement is found in bread, cereal, milk, meat,  and poultry.  Most Americans get more riboflavin than the recommended daily allowance, however riboflavin deficiency is not necessary for the supplements to help prevent headache. Side effects: energizing, green urine   Coenzyme Q10: This is present in almost all cells in the body and is critical component for the conversion of energy.  Recent studies  have shown that a nutritional supplement of CoQ10 can reduce the frequency of migraine attacks by improving the energy production of cells as with riboflavin.  Doses of 150 mg twice a day have been shown to be effective.   Melatonin: Increasing evidence shows correlation between melatonin secretion and headache conditions.  Melatonin supplementation has decreased headache intensity and duration.  It is widely used as a sleep aid.  Sleep is natures way of dealing with migraine.  A dose of 3 mg is recommended to start for headaches including cluster headache. Higher doses up to 15 mg has been reviewed for use in Cluster headache and have been used. The rationale behind using melatonin for cluster is that many theories regarding the cause of Cluster headache center around the disruption of the normal circadian rhythm in the brain.  This helps restore the normal circadian rhythm.   HEADACHE DIET: Foods and beverages which may trigger migraine Note that only 20% of headache patients are food sensitive. You will know if you are food sensitive if you get a headache consistently 20 minutes to 2 hours after eating a certain food. Only cut out a food if it causes headaches, otherwise you might remove foods you enjoy! What matters most for diet is to eat a well balanced healthy diet full of vegetables and low fat protein, and to not miss meals.   Chocolate, other sweets ALL cheeses except cottage and cream cheese Dairy products, yogurt, sour cream, ice cream Liver Meat extracts (Bovril, Marmite, meat tenderizers) Meats or fish which have undergone aging, fermenting, pickling or smoking. These include: Hotdogs,salami,Lox,sausage, mortadellas,smoked salmon, pepperoni, Pickled herring Pods of broad bean (English beans, Chinese pea pods, [place] (fava) beans, lima and navy beans Ripe avocado, ripe banana Yeast extracts or active yeast preparations such as Brewer's or Fleishman's (commercial bakes goods are  permitted) Tomato based foods, pizza (lasagna, etc.)   MSG (monosodium glutamate) is disguised as many things; look for these common aliases: Monopotassium glutamate Autolysed yeast Hydrolysed protein Sodium caseinate "flavorings" "all natural preservatives" Nutrasweet   Avoid all other foods that convincingly provoke headaches.   Resources: The Dizzy [place]air Laundry Your Headache Diet, migrainestrong.com  https://zamora-andrews.com/   Caffeine and Migraine For patients that have migraine, caffeine intake more than 3 days per week can lead to dependency and increased migraine frequency. I would recommend cutting back on your caffeine intake as best you can. The recommended amount of caffeine is 200-300 mg daily, although migraine patients may experience dependency at even lower doses. While you may notice an increase in headache temporarily, cutting back will be helpful for headaches in the long run. For more information on caffeine and migraine, visit: https://americanmigrainefoundation.org/resource-library/caffeine-and-migraine/   Headache Prevention Strategies:   1. Maintain a headache diary; learn to identify and avoid triggers.  - This can be a simple note where you log when you had a headache, associated symptoms, and medications used - There are several smartphone apps developed to help track migraines: Migraine Buddy, Migraine Monitor, Curelator N1-Headache App   Common triggers include: Emotional triggers: Emotional/Upset family or friends Emotional/Upset occupation Business reversal/success Anticipation anxiety Crisis-serious Post-crisis periodNew job/position   Physical  triggers: Vacation Day Weekend Strenuous Exercise High Altitude Location New Move Menstrual Day Physical Illness Oversleep/Not enough sleep Weather changes Light: Photophobia or light sesnitivity treatment involves a balance between desensitization and  reduction in overly strong input. Use dark polarized glasses outside, but not inside. Avoid bright or fluorescent light, but do not dim environment to the point that going into a normally lit room hurts. Consider FL-41 tint lenses, which reduce the most irritating wavelengths without blocking too much light.  These can be obtained at axonoptics.com or theraspecs.com Foods: see list above.   2. Limit use of acute treatments (over-the-counter medications, triptans, etc.) to no more than 2 days per week or 10 days per month to prevent medication overuse headache (rebound headache).     3. Follow a regular schedule (including weekends and holidays): Don't skip meals. Eat a balanced diet. 8 hours of sleep nightly. Minimize stress. Exercise 30 minutes per day. Being overweight is associated with a 5 times increased risk of chronic migraine. Keep well hydrated and drink 6-8 glasses of water per day.   4. Initiate non-pharmacologic measures at the earliest onset of your headache. Rest and quiet environment. Relax and reduce stress. Breathe2Relax is a free app that can instruct you on    some simple relaxtion and breathing techniques. Http://Dawnbuse.com is a    free website that provides teaching videos on relaxation.  Also, there are  many apps that   can be downloaded for "mindful" relaxation.  An app called YOGA NIDRA will help walk you through mindfulness. Another app called Calm can be downloaded to give you a structured mindfulness guide with daily reminders and skill development. Headspace for guided meditation Mindfulness Based Stress Reduction Online Course: www.palousemindfulness.com Cold compresses.   5. Don't wait!! Take the maximum allowable dosage of prescribed medication at the first sign of migraine.   6. Compliance:  Take prescribed medication regularly as directed and at the first sign of a migraine.   7. Communicate:  Call your physician when problems arise, especially if your  headaches change, increase in frequency/severity, or become associated with neurological symptoms (weakness, numbness, slurred speech, etc.). Proceed to emergency room if you experience new or worsening symptoms or symptoms do not resolve, if you have new neurologic symptoms or if headache is severe, or for any concerning symptom.   8. Headache/pain management therapies: Consider various complementary methods, including medication, behavioral therapy, psychological counselling, biofeedback, massage therapy, acupuncture, dry needling, and other modalities.  Such measures may reduce the need for medications. Counseling for pain management, where patients learn to function and ignore/minimize their pain, seems to work very well.   9. Recommend changing family's attention and focus away from patient's headaches. Instead, emphasize daily activities. If first question of day is 'How are your headaches/Do you have a headache today?', then patient will constantly think about headaches, thus making them worse. Goal is to re-direct attention away from headaches, toward daily activities and other distractions.   10. Helpful Websites: www.AmericanHeadacheSociety.org PatentHood.ch www.headaches.org TightMarket.nl www.achenet.org

## 2023-06-13 ENCOUNTER — Encounter: Payer: Self-pay | Admitting: Family Medicine

## 2023-06-13 ENCOUNTER — Other Ambulatory Visit (HOSPITAL_COMMUNITY): Payer: Self-pay

## 2023-06-13 ENCOUNTER — Ambulatory Visit: Payer: 59 | Admitting: Family Medicine

## 2023-06-13 VITALS — BP 116/68 | HR 73 | Ht 65.0 in | Wt 283.4 lb

## 2023-06-13 DIAGNOSIS — G43009 Migraine without aura, not intractable, without status migrainosus: Secondary | ICD-10-CM

## 2023-06-13 DIAGNOSIS — G932 Benign intracranial hypertension: Secondary | ICD-10-CM | POA: Diagnosis not present

## 2023-06-13 MED ORDER — ACETAZOLAMIDE 250 MG PO TABS
750.0000 mg | ORAL_TABLET | Freq: Two times a day (BID) | ORAL | 4 refills | Status: DC
  Filled 2023-06-13 – 2023-07-22 (×2): qty 360, 60d supply, fill #0

## 2023-07-09 ENCOUNTER — Other Ambulatory Visit (HOSPITAL_COMMUNITY): Payer: Self-pay

## 2023-07-09 ENCOUNTER — Ambulatory Visit (INDEPENDENT_AMBULATORY_CARE_PROVIDER_SITE_OTHER): Admitting: Family Medicine

## 2023-07-09 ENCOUNTER — Encounter (INDEPENDENT_AMBULATORY_CARE_PROVIDER_SITE_OTHER): Payer: Self-pay | Admitting: Family Medicine

## 2023-07-09 ENCOUNTER — Other Ambulatory Visit: Payer: Self-pay

## 2023-07-09 VITALS — BP 97/63 | HR 73 | Temp 98.3°F | Ht 65.0 in | Wt 281.0 lb

## 2023-07-09 DIAGNOSIS — R718 Other abnormality of red blood cells: Secondary | ICD-10-CM

## 2023-07-09 DIAGNOSIS — E559 Vitamin D deficiency, unspecified: Secondary | ICD-10-CM

## 2023-07-09 DIAGNOSIS — Z6841 Body Mass Index (BMI) 40.0 and over, adult: Secondary | ICD-10-CM | POA: Diagnosis not present

## 2023-07-09 MED ORDER — VITAMIN D3 1.25 MG (50000 UT) PO CAPS
1.0000 | ORAL_CAPSULE | ORAL | 0 refills | Status: DC
  Filled 2023-07-09: qty 12, 84d supply, fill #0

## 2023-07-09 MED ORDER — FUSION PLUS PO CAPS
1.0000 | ORAL_CAPSULE | Freq: Every day | ORAL | 0 refills | Status: DC
  Filled 2023-07-09: qty 90, 90d supply, fill #0

## 2023-07-09 NOTE — Progress Notes (Signed)
 SUBJECTIVE:  Chief Complaint: Obesity  Interim History: Patient has had an alright last 6 weeks.  She is still having issues with sugar cravings.  She has been trying to go to the gym more and be more consistent with that.  She is logging food when she can remember. She has been tracking but hasn't been following her total protein intake or calorie intake. 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  Biggest obstacle to logging is not finishing a full day.  Labella is here to discuss her progress with her obesity treatment plan. She is on the keeping a food journal and adhering to recommended goals of 1450-1600 calories and 95 grams of protein and states she is following her eating plan approximately 85 % of the time. She states she is exercising 60-90 minutes 1 times per week.   OBJECTIVE: Visit Diagnoses: Problem List Items Addressed This Visit       Other   Vitamin D  deficiency   Doing well on prescription strength Vitamin D .  Needs a refill today.  No nausea, vomiting or muscle weakness noted.      Relevant Medications   Cholecalciferol  (VITAMIN D3) 1.25 MG (50000 UT) CAPS   Microcytosis - Primary   Patient has been consistently taking her fusion plus capsules Some improvement in her fatigue but last CBC not at goal.  Needs refill of fusion plus today.      Relevant Medications   Iron -FA-B Cmp-C-Biot-Probiotic (FUSION PLUS) CAPS   Morbid obesity (HCC)   Anthropometric Measurements Height: 5\' 5"  (1.651 m) Weight: 281 lb (127.5 kg) BMI (Calculated): 46.76 Weight at Last Visit: 278 lb Weight Lost Since Last Visit: 0 Weight Gained Since Last Visit: 3 Starting Weight: 304 lb Total Weight Loss (lbs): 23 lb (10.4 kg) Body Composition  Body Fat %: 50.7 % Fat Mass (lbs): 142.8 lbs Muscle Mass (lbs): 132 lbs Total Body Water (lbs): 100.6 lbs Visceral Fat Rating : 14 Other Clinical Data Today's Visit #: 19 Starting Date: 10/03/21 Comments:  1450-1600/95       Other Visit Diagnoses       BMI 45.0-49.9, adult (HCC)           No data recorded       07/09/2023    2:00 PM 06/13/2023   10:53 AM 05/27/2023    2:00 PM  Vitals with BMI  Height 5\' 5"  5\' 5"  5\' 5"   Weight 281 lbs 283 lbs 6 oz 278 lbs  BMI 46.76 47.16 46.26  Systolic 97 116 101  Diastolic 63 68 66  Pulse 73 73 69      ASSESSMENT AND PLAN:  Diet: Chanon is currently in the action stage of change. As such, her goal is to continue with weight loss efforts and has agreed to keeping a food journal and adhering to recommended goals of 1450-1600 calories and 95 or more grams protein daily.   Exercise:  For substantial health benefits, adults should do at least 150 minutes (2 hours and 30 minutes) a week of moderate-intensity, or 75 minutes (1 hour and 15 minutes) a week of vigorous-intensity aerobic physical activity, or an equivalent combination of moderate- and vigorous-intensity aerobic activity. Aerobic activity should be performed in episodes of at least 10 minutes, and preferably, it should be spread throughout the week.  She is looking to switch to more of a day schedule job.  Behavior Modification:  We discussed the following Behavioral Modification Strategies  today: increasing lean protein intake, decreasing simple carbohydrates, meal planning and cooking strategies, and keep a strict food journal.   No follow-ups on file.Aaron Aas She was informed of the importance of frequent follow up visits to maximize her success with intensive lifestyle modifications for her multiple health conditions.  Attestation Statements:   Reviewed by clinician on day of visit: allergies, medications, problem list, medical history, surgical history, family history, social history, and previous encounter notes.     Donaciano Frizzle, MD

## 2023-07-12 ENCOUNTER — Other Ambulatory Visit (HOSPITAL_COMMUNITY): Payer: Self-pay

## 2023-07-14 NOTE — Assessment & Plan Note (Signed)
 Anthropometric Measurements Height: 5\' 5"  (1.651 m) Weight: 281 lb (127.5 kg) BMI (Calculated): 46.76 Weight at Last Visit: 278 lb Weight Lost Since Last Visit: 0 Weight Gained Since Last Visit: 3 Starting Weight: 304 lb Total Weight Loss (lbs): 23 lb (10.4 kg) Body Composition  Body Fat %: 50.7 % Fat Mass (lbs): 142.8 lbs Muscle Mass (lbs): 132 lbs Total Body Water (lbs): 100.6 lbs Visceral Fat Rating : 14 Other Clinical Data Today's Visit #: 19 Starting Date: 10/03/21 Comments: 1450-1600/95

## 2023-07-14 NOTE — Assessment & Plan Note (Signed)
 Doing well on prescription strength Vitamin D .  Needs a refill today.  No nausea, vomiting or muscle weakness noted.

## 2023-07-14 NOTE — Assessment & Plan Note (Signed)
 Patient has been consistently taking her fusion plus capsules Some improvement in her fatigue but last CBC not at goal.  Needs refill of fusion plus today.

## 2023-07-22 ENCOUNTER — Other Ambulatory Visit (HOSPITAL_COMMUNITY): Payer: Self-pay

## 2023-07-24 ENCOUNTER — Telehealth: Payer: Self-pay | Admitting: Family Medicine
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
## 2023-07-24 NOTE — Telephone Encounter (Signed)
 Pt called stating that the pressure in her eye is getting worse and she is needing to speak to a nurse to be advised on what she needs to do or where she needs to go. Please advise.

## 2023-07-25 ENCOUNTER — Emergency Department (HOSPITAL_BASED_OUTPATIENT_CLINIC_OR_DEPARTMENT_OTHER)
Admission: EM | Admit: 2023-07-25 | Discharge: 2023-07-25 | Disposition: A | Discharge status: Home / Self Care | Attending: Emergency Medicine | Admitting: Emergency Medicine

## 2023-07-25 ENCOUNTER — Other Ambulatory Visit: Payer: Self-pay

## 2023-07-25 ENCOUNTER — Emergency Department (HOSPITAL_BASED_OUTPATIENT_CLINIC_OR_DEPARTMENT_OTHER)

## 2023-07-25 ENCOUNTER — Encounter (HOSPITAL_BASED_OUTPATIENT_CLINIC_OR_DEPARTMENT_OTHER): Payer: Self-pay

## 2023-07-25 DIAGNOSIS — Y92410 Unspecified street and highway as the place of occurrence of the external cause: Secondary | ICD-10-CM | POA: Diagnosis not present

## 2023-07-25 DIAGNOSIS — R519 Headache, unspecified: Secondary | ICD-10-CM | POA: Diagnosis present

## 2023-07-25 DIAGNOSIS — M542 Cervicalgia: Secondary | ICD-10-CM | POA: Insufficient documentation

## 2023-07-25 DIAGNOSIS — H53022 Refractive amblyopia, left eye: Secondary | ICD-10-CM | POA: Diagnosis not present

## 2023-07-25 DIAGNOSIS — H4711 Papilledema associated with increased intracranial pressure: Secondary | ICD-10-CM | POA: Diagnosis not present

## 2023-07-25 MED ORDER — NAPROXEN 500 MG PO TABS
500.0000 mg | ORAL_TABLET | Freq: Two times a day (BID) | ORAL | 0 refills | Status: AC
  Filled 2023-07-25: qty 14, 7d supply, fill #0

## 2023-07-25 MED ORDER — CYCLOBENZAPRINE HCL 10 MG PO TABS
10.0000 mg | ORAL_TABLET | Freq: Two times a day (BID) | ORAL | 0 refills | Status: AC | PRN
  Filled 2023-07-25: qty 20, 10d supply, fill #0
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
## 2023-07-25 NOTE — ED Notes (Signed)
 Discharge paperwork given and verbally understood.
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
## 2023-07-25 NOTE — Discharge Instructions (Signed)
 The CT of your head did not show any acute findings.  Please follow-up with your neurologist as needed.  If you experience any worsening symptoms would return to the emergency department.

## 2023-07-26 ENCOUNTER — Other Ambulatory Visit (HOSPITAL_COMMUNITY): Payer: Self-pay

## 2023-08-14 ENCOUNTER — Ambulatory Visit (INDEPENDENT_AMBULATORY_CARE_PROVIDER_SITE_OTHER): Admitting: Family Medicine

## 2023-08-14 ENCOUNTER — Encounter (INDEPENDENT_AMBULATORY_CARE_PROVIDER_SITE_OTHER): Payer: Self-pay | Admitting: Family Medicine

## 2023-08-14 ENCOUNTER — Other Ambulatory Visit (HOSPITAL_COMMUNITY): Payer: Self-pay

## 2023-08-14 VITALS — BP 111/70 | HR 82 | Temp 98.3°F | Ht 65.0 in | Wt 282.0 lb

## 2023-08-14 DIAGNOSIS — E88819 Insulin resistance, unspecified: Secondary | ICD-10-CM

## 2023-08-14 DIAGNOSIS — Z6841 Body Mass Index (BMI) 40.0 and over, adult: Secondary | ICD-10-CM

## 2023-08-14 DIAGNOSIS — R718 Other abnormality of red blood cells: Secondary | ICD-10-CM

## 2023-08-14 MED ORDER — METFORMIN HCL 500 MG PO TABS
500.0000 mg | ORAL_TABLET | Freq: Every day | ORAL | 0 refills | Status: DC
  Filled 2023-08-14: qty 90, 90d supply, fill #0

## 2023-08-14 NOTE — Progress Notes (Signed)
 SUBJECTIVE:  Chief Complaint: Obesity  Interim History: patient a bit confused about scale results today.  She mentiosn she has been much more consistent with meal plan.  She is around 1700 calories and sometimes she is less than that.  She only would have gone over calories a few times and only around 50 calories.  She is going to [employer] and that has made it much easier to stay on task.  She is trying to figure out everything with school as she thought was going to be a full time student not a part time student.  She is starting a new job with [employer] and will be going to day shift.  Stephanie Cohen is here to discuss her progress with her obesity treatment plan. She is on the keeping a food journal and adhering to recommended goals of 1450-1600 calories and 95 grams of protein and states she is following her eating plan approximately 100 % of the time. She states she is not exercising.   OBJECTIVE: Visit Diagnoses: Problem List Items Addressed This Visit       Endocrine   Insulin  resistance - Primary   On metformin  with only occasional GI side effects.  Needs a refill today- no change in dose.  Refill sent in to pharmacy.      Relevant Medications   metFORMIN  (GLUCOPHAGE ) 500 MG tablet     Other   Microcytosis   Patient on fusion plus for iron  supplementation.  No GI side effects mentioned from taking this.  Will continue and will need repeat labs in the 2 months to ensure response to supplementation.      Morbid obesity (HCC)   Anthropometric Measurements Height: 5\' 5"  (1.651 m) Weight: 282 lb (127.9 kg) BMI (Calculated): 46.93 Weight at Last Visit: 281 lb Weight Lost Since Last Visit: 0 Weight Gained Since Last Visit: 1 Starting Weight: 304 lb Total Weight Loss (lbs): 22 lb (9.979 kg) Body Composition  Body Fat %: 51.3 % Fat Mass (lbs): 145 lbs Muscle Mass (lbs): 130.8 lbs Total Body Water (lbs): 101.6 lbs Visceral Fat Rating : 15 Other Clinical  Data Today's Visit #: 20 Starting Date: 10/03/21 Comments: 1450-1600/95       Relevant Medications   metFORMIN  (GLUCOPHAGE ) 500 MG tablet   Other Visit Diagnoses       BMI 45.0-49.9, adult (HCC)       Relevant Medications   metFORMIN  (GLUCOPHAGE ) 500 MG tablet       No data recorded       08/14/2023    3:00 PM 07/25/2023    7:30 PM 07/25/2023    5:59 PM  Vitals with BMI  Height 5\' 5"   5\' 5"   Weight 282 lbs  282 lbs  BMI 46.93  46.93  Systolic 111 116   Diastolic 70 60   Pulse 82 67       ASSESSMENT AND PLAN:  Diet: Stephanie Cohen is currently in the action stage of change. As such, her goal is to continue with weight loss efforts and has agreed to keeping a food journal and adhering to recommended goals of 1450-1600 calories and 90 or more grams protein daily.   Exercise:  All adults should avoid inactivity. Some activity is better than none, and adults who participate in any amount of physical activity, gain some health benefits.  Behavior Modification:  We discussed the following Behavioral Modification Strategies today: increasing lean protein intake, decreasing simple carbohydrates, increasing vegetables, meal planning and  cooking strategies, keeping healthy foods in the home, avoiding temptations, and keep a strict food journal.   Return in about 5 weeks (around 09/18/2023).   She was informed of the importance of frequent follow up visits to maximize her success with intensive lifestyle modifications for her multiple health conditions.  Attestation Statements:   Reviewed by clinician on day of visit: allergies, medications, problem list, medical history, surgical history, family history, social history, and previous encounter notes.   Donaciano Frizzle, MD

## 2023-08-22 NOTE — Assessment & Plan Note (Signed)
 Patient on fusion plus for iron  supplementation.  No GI side effects mentioned from taking this.  Will continue and will need repeat labs in the 2 months to ensure response to supplementation.

## 2023-08-22 NOTE — Assessment & Plan Note (Signed)
 On metformin  with only occasional GI side effects.  Needs a refill today- no change in dose.  Refill sent in to pharmacy.

## 2023-08-22 NOTE — Assessment & Plan Note (Signed)
 Anthropometric Measurements Height: 5\' 5"  (1.651 m) Weight: 282 lb (127.9 kg) BMI (Calculated): 46.93 Weight at Last Visit: 281 lb Weight Lost Since Last Visit: 0 Weight Gained Since Last Visit: 1 Starting Weight: 304 lb Total Weight Loss (lbs): 22 lb (9.979 kg) Body Composition  Body Fat %: 51.3 % Fat Mass (lbs): 145 lbs Muscle Mass (lbs): 130.8 lbs Total Body Water (lbs): 101.6 lbs Visceral Fat Rating : 15 Other Clinical Data Today's Visit #: 20 Starting Date: 10/03/21 Comments: 1450-1600/95

## 2023-09-02 ENCOUNTER — Encounter: Payer: Self-pay | Admitting: Neurology

## 2023-09-12 ENCOUNTER — Other Ambulatory Visit: Payer: Self-pay | Admitting: Hematology and Oncology

## 2023-09-12 ENCOUNTER — Other Ambulatory Visit: Payer: Self-pay | Admitting: Neurology

## 2023-09-13 NOTE — Telephone Encounter (Signed)
 Last seen on 06/13/23 Follow up scheduled on 06/11/24

## 2023-09-19 ENCOUNTER — Encounter (INDEPENDENT_AMBULATORY_CARE_PROVIDER_SITE_OTHER): Payer: Self-pay | Admitting: Family Medicine

## 2023-09-19 ENCOUNTER — Ambulatory Visit (INDEPENDENT_AMBULATORY_CARE_PROVIDER_SITE_OTHER): Payer: Self-pay | Admitting: Family Medicine

## 2023-09-19 VITALS — BP 107/71 | HR 56 | Temp 98.2°F | Ht 65.0 in | Wt 277.0 lb

## 2023-09-19 DIAGNOSIS — E88819 Insulin resistance, unspecified: Secondary | ICD-10-CM

## 2023-09-19 DIAGNOSIS — Z6841 Body Mass Index (BMI) 40.0 and over, adult: Secondary | ICD-10-CM

## 2023-09-19 DIAGNOSIS — E669 Obesity, unspecified: Secondary | ICD-10-CM

## 2023-09-19 DIAGNOSIS — G932 Benign intracranial hypertension: Secondary | ICD-10-CM

## 2023-09-19 MED ORDER — ACETAZOLAMIDE 250 MG PO TABS: 750.0000 mg | ORAL_TABLET | Freq: Two times a day (BID) | ORAL | 1 refills | Status: AC

## 2023-09-19 MED ORDER — METFORMIN HCL 500 MG PO TABS
500.0000 mg | ORAL_TABLET | Freq: Every day | ORAL | 0 refills | Status: DC
Start: 2023-09-19 — End: 2024-01-22

## 2023-09-19 NOTE — Progress Notes (Signed)
 SUBJECTIVE:  Chief Complaint: Obesity  Interim History: Patient started her new job where she is working day shift.  She is not working the floor so she doesn't have the same stress.  She is hopeful to start  going to the gym after work since she now has the energy to do so.  She has been sticking to journaling consistently and she has stuck with [employer].  Not having to think about that has been very beneficial.  She is enjoying eating throughout the day and know she is getting her nutrition in.  She does occasionally order out and is still paying attention to what her calories are.  She is trying to pay attention to cues when she is full.  She is working for [employer] and doing some homework and studying.   Stephanie Cohen is here to discuss her progress with her obesity treatment plan. She is on the keeping a food journal and adhering to recommended goals of 1450-1600 calories and 90 grams of protein and states she is following her eating plan approximately 100 % of the time. She states she is not exercising consistently.  OBJECTIVE: Visit Diagnoses: Problem List Items Addressed This Visit       Endocrine   Insulin  resistance - Primary   Relevant Medications   metFORMIN  (GLUCOPHAGE ) 500 MG tablet     Other   Intracranial hypertension   Relevant Medications   acetaZOLAMIDE  (DIAMOX ) 250 MG tablet   Morbid obesity (HCC)   Relevant Medications   metFORMIN  (GLUCOPHAGE ) 500 MG tablet   Other Visit Diagnoses       BMI 45.0-49.9, adult (HCC)       Relevant Medications   metFORMIN  (GLUCOPHAGE ) 500 MG tablet       Vitals Temp: 98.2 F (36.8 C) BP: 107/71 Pulse Rate: (!) 56 SpO2: 99 %        ASSESSMENT AND PLAN: Assessment & Plan Insulin  resistance On metformin  daily with no GI side effects.  Needs a refill of metformin  with no change in doses. Intracranial hypertension Patient has been consistent on her acetazolamide  with no new side effects mentioned.  Needs  refill today. Obesity with starting BMI of 50.6 Anthropometric Measurements Height: 5' 5 (1.651 m) Weight: 277 lb (125.6 kg) BMI (Calculated): 46.1 Weight at Last Visit: 282 lb Weight Lost Since Last Visit: 5 Weight Gained Since Last Visit: 0 Starting Weight: 304 lb Total Weight Loss (lbs): 27 lb (12.2 kg) Body Composition  Body Fat %: 51.3 % Fat Mass (lbs): 142.2 lbs Muscle Mass (lbs): 128 lbs Total Body Water (lbs): 98.6 lbs Visceral Fat Rating : 14 Other Clinical Data Today's Visit #: 21 Starting Date: 10/03/21 Comments: 1450-1600/95  BMI 45.0-49.9, adult (HCC)    Diet: Stephanie Cohen is currently in the action stage of change. As such, her goal is to continue with weight loss efforts and has agreed to keeping a food journal and adhering to recommended goals of 1450-1600 calories and 90 or more grams of protein daily.   Exercise:  For substantial health benefits, adults should do at least 150 minutes (2 hours and 30 minutes) a week of moderate-intensity, or 75 minutes (1 hour and 15 minutes) a week of vigorous-intensity aerobic physical activity, or an equivalent combination of moderate- and vigorous-intensity aerobic activity. Aerobic activity should be performed in episodes of at least 10 minutes, and preferably, it should be spread throughout the week.  She is wanting to work on organically getting activity in during  her day to day life.   Behavior Modification:  We discussed the following Behavioral Modification Strategies today: increasing lean protein intake, decreasing simple carbohydrates, increasing vegetables, meal planning and cooking strategies, keeping healthy foods in the home, planning for success, and keep a strict food journal.   Return in about 8 weeks (around 11/14/2023).   She was informed of the importance of frequent follow up visits to maximize her success with intensive lifestyle modifications for her multiple health conditions.  Attestation Statements:    Reviewed by clinician on day of visit: allergies, medications, problem list, medical history, surgical history, family history, social history, and previous encounter notes.   [name], MD

## 2023-09-29 NOTE — Assessment & Plan Note (Signed)
 Patient has been consistent on her acetazolamide  with no new side effects mentioned.  Needs refill today.

## 2023-09-29 NOTE — Assessment & Plan Note (Signed)
 On metformin  daily with no GI side effects.  Needs a refill of metformin  with no change in doses.

## 2023-09-29 NOTE — Assessment & Plan Note (Signed)
 Anthropometric Measurements Height: 5' 5 (1.651 m) Weight: 277 lb (125.6 kg) BMI (Calculated): 46.1 Weight at Last Visit: 282 lb Weight Lost Since Last Visit: 5 Weight Gained Since Last Visit: 0 Starting Weight: 304 lb Total Weight Loss (lbs): 27 lb (12.2 kg) Body Composition  Body Fat %: 51.3 % Fat Mass (lbs): 142.2 lbs Muscle Mass (lbs): 128 lbs Total Body Water (lbs): 98.6 lbs Visceral Fat Rating : 14 Other Clinical Data Today's Visit #: 21 Starting Date: 10/03/21 Comments: 1450-1600/95

## 2023-11-15 ENCOUNTER — Other Ambulatory Visit (INDEPENDENT_AMBULATORY_CARE_PROVIDER_SITE_OTHER): Payer: Self-pay | Admitting: Family Medicine

## 2023-11-15 DIAGNOSIS — R718 Other abnormality of red blood cells: Secondary | ICD-10-CM

## 2023-11-19 ENCOUNTER — Encounter (INDEPENDENT_AMBULATORY_CARE_PROVIDER_SITE_OTHER): Payer: Self-pay | Admitting: Family Medicine

## 2023-11-19 ENCOUNTER — Ambulatory Visit (INDEPENDENT_AMBULATORY_CARE_PROVIDER_SITE_OTHER): Admitting: Family Medicine

## 2023-11-19 VITALS — BP 105/64 | HR 58 | Temp 98.5°F | Ht 65.0 in | Wt 272.0 lb

## 2023-11-19 DIAGNOSIS — Z6841 Body Mass Index (BMI) 40.0 and over, adult: Secondary | ICD-10-CM

## 2023-11-19 DIAGNOSIS — R718 Other abnormality of red blood cells: Secondary | ICD-10-CM | POA: Diagnosis not present

## 2023-11-19 DIAGNOSIS — E669 Obesity, unspecified: Secondary | ICD-10-CM | POA: Diagnosis not present

## 2023-11-19 DIAGNOSIS — E88819 Insulin resistance, unspecified: Secondary | ICD-10-CM

## 2023-11-19 DIAGNOSIS — E559 Vitamin D deficiency, unspecified: Secondary | ICD-10-CM

## 2023-11-19 MED ORDER — VITAMIN D3 1.25 MG (50000 UT) PO CAPS
1.0000 | ORAL_CAPSULE | ORAL | 0 refills | Status: DC
Start: 2023-11-19 — End: 2024-01-22

## 2023-11-19 MED ORDER — FUSION PLUS PO CAPS: 1.0000 | ORAL_CAPSULE | Freq: Every day | ORAL | 0 refills | Status: DC

## 2023-11-19 NOTE — Progress Notes (Signed)
 SUBJECTIVE:  Chief Complaint: Obesity  Interim History: Patient has been going to [employer] to make food prep and planning easier for herself.  She voices that she went out with her sister over the weekend for her sister's birthday.  Her girlfriend moved back to [place] and now they have had to adjust with figuring out how to stay focused on nutrition and limiting eating out. She is eating breakfast and dinner prepared from [employer].  She is averaging around 1100-1200 calories daily and about 60 grams of protein daily.  Only thing going on the next month is school.  Stephanie Cohen is here to discuss her progress with her obesity treatment plan. She is on the keeping a food journal and adhering to recommended goals of 1450-1600 calories and 90 grams of protein and states she is following her eating plan approximately 95 % of the time. She states she is not exercising.  OBJECTIVE: Visit Diagnoses: Problem List Items Addressed This Visit   None   Vitals Temp: 98.5 F (36.9 C) BP: 105/64 Pulse Rate: (!) 58 SpO2: 100 %   Anthropometric Measurements Height: 5' 5 (1.651 m) Weight: 272 lb (123.4 kg) BMI (Calculated): 45.26 Weight at Last Visit: 277 lb Weight Lost Since Last Visit: 5 Weight Gained Since Last Visit: 0 Starting Weight: 304 lb Total Weight Loss (lbs): 32 lb (14.5 kg)   Body Composition  Body Fat %: 52.6 % Fat Mass (lbs): 143.2 lbs Muscle Mass (lbs): 122.4 lbs Visceral Fat Rating : 14   Other Clinical Data Today's Visit #: 23 Starting Date: 10/03/21 Comments: 1450-1600/90     ASSESSMENT AND PLAN: Assessment & Plan Microcytosis Patient has been consistent in her usage of fusion plus iron  supplementation for assistance in her microcytosis.  Will repeat anemia panel as well as CBC today.  Plan to discuss labs at next appointment. Vitamin D  deficiency Patient has been consistent on vitamin D  supplementation since initial appointment showing vitamin D   deficiency.  She denies nausea, vomiting, and muscle weakness.  Will repeat vitamin D  level today. Obesity with starting BMI of 50.6  BMI 45.0-49.9, adult (HCC)  Insulin  resistance Patient has been working on dietary changes and has been staying within a food calorie intake goal of 1450 to 1600 cal daily with a focus on getting at least 95 or more grams of protein in daily.  She has been using [employer] meals to help assist in her nutrition.  Will repeat A1c and insulin  level today.   Diet: Colisha is currently in the action stage of change. As such, her goal is to continue with weight loss efforts and has agreed to keeping a food journal and adhering to recommended goals of 1450-1600 calories and 95 or more grams of protein.   Exercise:  For substantial health benefits, adults should do at least 150 minutes (2 hours and 30 minutes) a week of moderate-intensity, or 75 minutes (1 hour and 15 minutes) a week of vigorous-intensity aerobic physical activity, or an equivalent combination of moderate- and vigorous-intensity aerobic activity. Aerobic activity should be performed in episodes of at least 10 minutes, and preferably, it should be spread throughout the week.  Behavior Modification:  We discussed the following Behavioral Modification Strategies today: increasing lean protein intake, decreasing simple carbohydrates, increasing vegetables, meal planning and cooking strategies, and keep a strict food journal.  Follow-up in 4 to 6 weeks.  She was informed of the importance of frequent follow up visits to maximize  her success with intensive lifestyle modifications for her multiple health conditions.  Attestation Statements:   Reviewed by clinician on day of visit: allergies, medications, problem list, medical history, surgical history, family history, social history, and previous encounter notes.     [name], MD

## 2023-11-21 LAB — ANEMIA PANEL
Ferritin: 111 ng/mL (ref 15–150)
Folate, Hemolysate: 446 ng/mL
Folate, RBC: 1030 ng/mL (ref >498)
Hematocrit: 43.3 % (ref 34.0–46.6)
Iron Saturation: 11 % — ABNORMAL LOW (ref 15–55)
Iron: 28 ug/dL (ref 27–159)
Retic Ct Pct: 1.1 % (ref 0.6–2.6)
Total Iron Binding Capacity: 254 ug/dL (ref 250–450)
UIBC: 226 ug/dL (ref 131–425)
Vitamin B-12: 446 pg/mL (ref 232–1245)

## 2023-11-21 LAB — CBC WITH DIFFERENTIAL/PLATELET
Basophils Absolute: 0.1 x10E3/uL (ref 0.0–0.2)
Basos: 1 %
EOS (ABSOLUTE): 0.2 x10E3/uL (ref 0.0–0.4)
Eos: 2 %
Hemoglobin: 12.5 g/dL (ref 11.1–15.9)
Immature Grans (Abs): 0 x10E3/uL (ref 0.0–0.1)
Immature Granulocytes: 0 %
Lymphocytes Absolute: 3.2 x10E3/uL — ABNORMAL HIGH (ref 0.7–3.1)
Lymphs: 30 %
MCH: 20.8 pg — ABNORMAL LOW (ref 26.6–33.0)
MCHC: 28.9 g/dL — ABNORMAL LOW (ref 31.5–35.7)
MCV: 72 fL — ABNORMAL LOW (ref 79–97)
Monocytes Absolute: 0.6 x10E3/uL (ref 0.1–0.9)
Monocytes: 6 %
Neutrophils Absolute: 6.7 x10E3/uL (ref 1.4–7.0)
Neutrophils: 61 %
Platelets: 369 x10E3/uL (ref 150–450)
RBC: 6.02 x10E6/uL — ABNORMAL HIGH (ref 3.77–5.28)
RDW: 18.4 % — ABNORMAL HIGH (ref 11.7–15.4)
WBC: 10.8 x10E3/uL (ref 3.4–10.8)

## 2023-11-21 LAB — HEMOGLOBIN A1C
Est. average glucose Bld gHb Est-mCnc: 111 mg/dL
Hgb A1c MFr Bld: 5.5 % (ref 4.8–5.6)

## 2023-11-21 LAB — INSULIN, RANDOM: INSULIN: 60.1 u[IU]/mL — AB (ref 2.6–24.9)

## 2023-11-21 LAB — VITAMIN D 25 HYDROXY (VIT D DEFICIENCY, FRACTURES): Vit D, 25-Hydroxy: 43.2 ng/mL (ref 30.0–100.0)

## 2023-11-25 NOTE — Assessment & Plan Note (Signed)
 Patient has been consistent on vitamin D  supplementation since initial appointment showing vitamin D  deficiency.  She denies nausea, vomiting, and muscle weakness.  Will repeat vitamin D  level today.

## 2023-11-25 NOTE — Assessment & Plan Note (Signed)
 Patient has been working on dietary changes and has been staying within a food calorie intake goal of 1450 to 1600 cal daily with a focus on getting at least 95 or more grams of protein in daily.  She has been using [employer] meals to help assist in her nutrition.  Will repeat A1c and insulin  level today.

## 2023-11-25 NOTE — Assessment & Plan Note (Signed)
 Patient has been consistent in her usage of fusion plus iron  supplementation for assistance in her microcytosis.  Will repeat anemia panel as well as CBC today.  Plan to discuss labs at next appointment.

## 2023-12-18 ENCOUNTER — Other Ambulatory Visit (INDEPENDENT_AMBULATORY_CARE_PROVIDER_SITE_OTHER): Payer: Self-pay | Admitting: Family Medicine

## 2023-12-18 DIAGNOSIS — E88819 Insulin resistance, unspecified: Secondary | ICD-10-CM

## 2023-12-24 ENCOUNTER — Telehealth: Payer: Self-pay | Admitting: Family Medicine

## 2023-12-24 NOTE — Telephone Encounter (Signed)
 I called pt to move her 05/18/24 appt from bump list, Amy's first avail is 12/22/24, pt says she was coming in to discuss med management. She wants to know what to do since appt is now 6 months later. TY

## 2024-01-02 ENCOUNTER — Ambulatory Visit (INDEPENDENT_AMBULATORY_CARE_PROVIDER_SITE_OTHER): Admitting: Family Medicine

## 2024-01-02 ENCOUNTER — Other Ambulatory Visit (INDEPENDENT_AMBULATORY_CARE_PROVIDER_SITE_OTHER): Payer: Self-pay | Admitting: Family Medicine

## 2024-01-02 DIAGNOSIS — E88819 Insulin resistance, unspecified: Secondary | ICD-10-CM

## 2024-01-22 ENCOUNTER — Ambulatory Visit (INDEPENDENT_AMBULATORY_CARE_PROVIDER_SITE_OTHER): Payer: Self-pay | Admitting: Family Medicine

## 2024-01-22 ENCOUNTER — Encounter (INDEPENDENT_AMBULATORY_CARE_PROVIDER_SITE_OTHER): Payer: Self-pay | Admitting: Family Medicine

## 2024-01-22 VITALS — BP 97/65 | HR 73 | Temp 98.5°F | Ht 65.0 in | Wt 275.0 lb

## 2024-01-22 DIAGNOSIS — F902 Attention-deficit hyperactivity disorder, combined type: Secondary | ICD-10-CM | POA: Diagnosis not present

## 2024-01-22 DIAGNOSIS — E559 Vitamin D deficiency, unspecified: Secondary | ICD-10-CM | POA: Diagnosis not present

## 2024-01-22 DIAGNOSIS — E88819 Insulin resistance, unspecified: Secondary | ICD-10-CM

## 2024-01-22 DIAGNOSIS — E669 Obesity, unspecified: Secondary | ICD-10-CM

## 2024-01-22 DIAGNOSIS — Z6841 Body Mass Index (BMI) 40.0 and over, adult: Secondary | ICD-10-CM | POA: Diagnosis not present

## 2024-01-22 MED ORDER — METFORMIN HCL 500 MG PO TABS: 500.0000 mg | ORAL_TABLET | Freq: Every day | ORAL | 0 refills | Status: DC

## 2024-01-22 MED ORDER — VITAMIN D3 1.25 MG (50000 UT) PO CAPS: 1.0000 | ORAL_CAPSULE | ORAL | 0 refills | Status: DC

## 2024-01-22 MED ORDER — AMPHETAMINE-DEXTROAMPHET ER 10 MG PO CP24: 10.0000 mg | ORAL_CAPSULE | Freq: Every day | ORAL | 0 refills | Status: DC

## 2024-01-22 NOTE — Progress Notes (Signed)
 SUBJECTIVE:  Chief Complaint: Obesity  Interim History: Patient has been dealing with quite a bit over the last two months.  Her godmother passed away and she her next class started quickly.  She has been having issues with sleeping and her her sleep schedule has changed because her class schedule has changed. She was diagnosed with ADHD later in life and mentions her symptoms are flaring quite a bit recently.  School schedule is 8am and she is struggling to get there at time.  Has not been as consistent with her intake due to focus and finances.    Stephanie Cohen is here to discuss her progress with her obesity treatment plan. She is on the keeping a food journal and adhering to recommended goals of 1450-1600 calories and 95 grams of protein and states she is following her eating plan approximately 30 % of the time. She states she is not exercising.   OBJECTIVE: Visit Diagnoses: Problem List Items Addressed This Visit       Endocrine   Insulin  resistance   Relevant Medications   metFORMIN  (GLUCOPHAGE ) 500 MG tablet     Other   Vitamin D  deficiency   Relevant Medications   Cholecalciferol  (VITAMIN D3) 1.25 MG (50000 UT) CAPS   Morbid obesity (HCC)   Relevant Medications   amphetamine-dextroamphetamine (ADDERALL XR) 10 MG 24 hr capsule   metFORMIN  (GLUCOPHAGE ) 500 MG tablet   Other Visit Diagnoses       ADHD (attention deficit hyperactivity disorder), combined type    -  Primary   Relevant Medications   amphetamine-dextroamphetamine (ADDERALL XR) 10 MG 24 hr capsule     BMI 45.0-49.9, adult (HCC)       Relevant Medications   amphetamine-dextroamphetamine (ADDERALL XR) 10 MG 24 hr capsule   metFORMIN  (GLUCOPHAGE ) 500 MG tablet       Vitals Temp: 98.5 F (36.9 C) BP: 97/65 Pulse Rate: 73 SpO2: 99 %   Anthropometric Measurements Height: 5' 5 (1.651 m) Weight: 275 lb (124.7 kg) BMI (Calculated): 45.76 Weight at Last Visit: 272 lb Weight Lost Since Last Visit: 0 Weight  Gained Since Last Visit: 3 Starting Weight: 304 lb Total Weight Loss (lbs): 29 lb (13.2 kg)   Body Composition  Body Fat %: 49.5 % Fat Mass (lbs): 136.6 lbs Muscle Mass (lbs): 132.2 lbs Total Body Water (lbs): 97.6 lbs Visceral Fat Rating : 14   Other Clinical Data Today's Visit #: 24 Starting Date: 10/03/21 Comments: 1450-1600/95     ASSESSMENT AND PLAN: Assessment & Plan ADHD (attention deficit hyperactivity disorder), combined type Patient diagnosed later in life.  Previously tolerated and did well on Adderall for management of her ADHD symptoms.  PDMP checked and patient has no recent prescriptions.  Will prescribe Adderall 10 mg daily and follow-up at next appointment as to patient's response to this dosage. Vitamin D  deficiency Needs refill of prescription strength vitamin D  today.  No nausea, vomiting, or muscle weakness reported. Insulin  resistance Doing well on metformin  with no GI side effects.  Needs a refill today.  No change in dosage.  Follow-up at next appointment as to whether metformin  plus Adderall allows patient to get total daily intake in terms of calories and nutrition. Obesity with starting BMI of 50.6  BMI 45.0-49.9, adult (HCC)    Diet: Thena is currently in the action stage of change. As such, her goal is to continue with weight loss efforts and has agreed to keeping a food journal and adhering to recommended goals  of 1450-1600 calories and 95 or more grams of protein daily.   Exercise:  For substantial health benefits, adults should do at least 150 minutes (2 hours and 30 minutes) a week of moderate-intensity, or 75 minutes (1 hour and 15 minutes) a week of vigorous-intensity aerobic physical activity, or an equivalent combination of moderate- and vigorous-intensity aerobic activity. Aerobic activity should be performed in episodes of at least 10 minutes, and preferably, it should be spread throughout the week.  Behavior Modification:  We  discussed the following Behavioral Modification Strategies today: increasing lean protein intake, decreasing simple carbohydrates, increasing vegetables, meal planning and cooking strategies, planning for success, and keep a strict food journal.   Return in about 4 weeks (around 02/19/2024).   She was informed of the importance of frequent follow up visits to maximize her success with intensive lifestyle modifications for her multiple health conditions.  Attestation Statements:   Reviewed by clinician on day of visit: allergies, medications, problem list, medical history, surgical history, family history, social history, and previous encounter notes.   [name], MD

## 2024-01-29 NOTE — Assessment & Plan Note (Signed)
 Needs refill of prescription strength vitamin D  today.  No nausea, vomiting, or muscle weakness reported.

## 2024-01-29 NOTE — Assessment & Plan Note (Signed)
 Doing well on metformin  with no GI side effects.  Needs a refill today.  No change in dosage.  Follow-up at next appointment as to whether metformin  plus Adderall allows patient to get total daily intake in terms of calories and nutrition.

## 2024-02-10 DIAGNOSIS — H53022 Refractive amblyopia, left eye: Secondary | ICD-10-CM | POA: Diagnosis not present

## 2024-02-10 DIAGNOSIS — H4711 Papilledema associated with increased intracranial pressure: Secondary | ICD-10-CM | POA: Diagnosis not present

## 2024-02-19 ENCOUNTER — Ambulatory Visit (INDEPENDENT_AMBULATORY_CARE_PROVIDER_SITE_OTHER): Admitting: Family Medicine

## 2024-02-19 ENCOUNTER — Encounter (INDEPENDENT_AMBULATORY_CARE_PROVIDER_SITE_OTHER): Payer: Self-pay | Admitting: Family Medicine

## 2024-02-19 ENCOUNTER — Encounter (INDEPENDENT_AMBULATORY_CARE_PROVIDER_SITE_OTHER): Payer: Self-pay

## 2024-02-19 VITALS — BP 107/70 | HR 72 | Temp 98.6°F | Ht 65.0 in | Wt 272.0 lb

## 2024-02-19 DIAGNOSIS — R718 Other abnormality of red blood cells: Secondary | ICD-10-CM

## 2024-02-19 DIAGNOSIS — E669 Obesity, unspecified: Secondary | ICD-10-CM | POA: Diagnosis not present

## 2024-02-19 DIAGNOSIS — F902 Attention-deficit hyperactivity disorder, combined type: Secondary | ICD-10-CM

## 2024-02-19 DIAGNOSIS — Z6841 Body Mass Index (BMI) 40.0 and over, adult: Secondary | ICD-10-CM

## 2024-02-19 MED ORDER — AMPHETAMINE-DEXTROAMPHET ER 20 MG PO CP24: 20.0000 mg | ORAL_CAPSULE | Freq: Every day | ORAL | 0 refills | Status: DC

## 2024-02-19 MED ORDER — FUSION PLUS PO CAPS: 1.0000 | ORAL_CAPSULE | Freq: Every day | ORAL | 0 refills | Status: AC

## 2024-02-19 NOTE — Progress Notes (Signed)
 SUBJECTIVE:  Chief Complaint: Obesity  Interim History: Patient had a good Thanksgiving- ate her girlfriend's house and then had a quiet gathering with her family.  Not sure what her average protein and calories have been.  Most weeks she is eating protein waffles for breakfast and then take out- frequently is [employer].  If she deviates off her routine she is not sure what her intake is.  If she takes Adderall she has to force herself to eat.   Stephanie Cohen is here to discuss her progress with her obesity treatment plan. She is on the keeping a food journal and adhering to recommended goals of 1450-1600 calories and 95 grams of protein and states she is following her eating plan approximately 75 % of the time. She states she is not exercising.  OBJECTIVE: Visit Diagnoses: Problem List Items Addressed This Visit       Other   Microcytosis - Primary   Relevant Medications   Iron -FA-B Cmp-C-Biot-Probiotic (FUSION PLUS) CAPS   Morbid obesity (HCC)   Relevant Medications   amphetamine -dextroamphetamine (ADDERALL XR) 20 MG 24 hr capsule   Other Visit Diagnoses       ADHD (attention deficit hyperactivity disorder), combined type         BMI 45.0-49.9, adult (HCC)       Relevant Medications   amphetamine -dextroamphetamine (ADDERALL XR) 20 MG 24 hr capsule       Vitals Temp: 98.6 F (37 C) BP: 107/70 Pulse Rate: 72 SpO2: 100 %   Anthropometric Measurements Height: 5' 5 (1.651 m) Weight: 272 lb (123.4 kg) BMI (Calculated): 45.26 Weight at Last Visit: 275 lb Weight Lost Since Last Visit: 3 Weight Gained Since Last Visit: 0 Starting Weight: 304 lb Total Weight Loss (lbs): 32 lb (14.5 kg)   Body Composition  Body Fat %: 49.9 % Fat Mass (lbs): 135.8 lbs Muscle Mass (lbs): 129.4 lbs Total Body Water (lbs): 95.8 lbs Visceral Fat Rating : 14   Other Clinical Data Today's Visit #: 25 Starting Date: 10/03/21 Comments: 1450-1600/95     ASSESSMENT AND PLAN: Assessment  & Plan Microcytosis Tolerating fusion plus and needs a refill of this today.  Patient reports consistency in taking her vitamin daily.  Will need to repeat labs in the upcoming 2-1/2 months. ADHD (attention deficit hyperactivity disorder), combined type Patient reports some improvement in symptoms of ADHD on starting dose of Adderall.  Reports that she feels as though she could have been higher dose to help with her symptoms.  Does report decreased desire to eat when taking Adderall.  Will refill Adderall today and increase dose to 20 mg.  PDMP checked with no concerns BMI 45.0-49.9, adult (HCC)  Obesity with starting BMI of 50.6    Diet: Kalayla is currently in the action stage of change. As such, her goal is to continue with weight loss efforts and has agreed to keeping a food journal and adhering to recommended goals of 1450-1600 calories and 95 or more grams of protein daily.   Exercise:  For substantial health benefits, adults should do at least 150 minutes (2 hours and 30 minutes) a week of moderate-intensity, or 75 minutes (1 hour and 15 minutes) a week of vigorous-intensity aerobic physical activity, or an equivalent combination of moderate- and vigorous-intensity aerobic activity. Aerobic activity should be performed in episodes of at least 10 minutes, and preferably, it should be spread throughout the week.- Patient to increase activity over the winter break from school  Behavior Modification:  We  discussed the following Behavioral Modification Strategies today: increasing lean protein intake, increasing vegetables, no skipping meals, meal planning and cooking strategies, holiday eating strategies, planning for success, and keep a strict food journal.   Return in about 5 weeks (around 03/25/2024).   She was informed of the importance of frequent follow up visits to maximize her success with intensive lifestyle modifications for her multiple health conditions.  Attestation Statements:    Reviewed by clinician on day of visit: allergies, medications, problem list, medical history, surgical history, family history, social history, and previous encounter notes.    [name], MD

## 2024-03-04 ENCOUNTER — Telehealth (INDEPENDENT_AMBULATORY_CARE_PROVIDER_SITE_OTHER): Payer: Self-pay | Admitting: Family Medicine

## 2024-03-04 NOTE — Telephone Encounter (Signed)
 Pt called and was prescribed the fusion plus medication and now has [employer] and they are stating it is not covered or it may need authorization. Please advise pt what she is needing to do.

## 2024-03-04 NOTE — Assessment & Plan Note (Signed)
 Tolerating fusion plus and needs a refill of this today.  Patient reports consistency in taking her vitamin daily.  Will need to repeat labs in the upcoming 2-1/2 months.

## 2024-03-05 NOTE — Telephone Encounter (Signed)
 WE DENIED: Service Description Code 1 Code 2 Plan Denied Dates Denied  Amount FUSION PLUS Capsule [id number] Medicaid 03/05/2024 90 units COMMENTS:  FUSION PLUS Capsule is an excluded product/service. The health plan does not cover non-formulary overthe-counter products.

## 2024-03-23 NOTE — Telephone Encounter (Signed)
 Checked the [facility] [employer] card and the PA was sent to [facility] which denied PA

## 2024-03-24 ENCOUNTER — Telehealth (INDEPENDENT_AMBULATORY_CARE_PROVIDER_SITE_OTHER): Payer: Self-pay | Admitting: Family Medicine

## 2024-03-24 NOTE — Telephone Encounter (Signed)
 Pt called and she talked with insurance and they are stating it can be received over the counter but it is not accessible for the pt. She has a rx for fusion plus. Needs a letter of necessity possible sent to insurance to get them to cover it.We should have gotten a fax regarding this rx possibly yesterday.

## 2024-03-27 ENCOUNTER — Emergency Department (HOSPITAL_BASED_OUTPATIENT_CLINIC_OR_DEPARTMENT_OTHER): Admitting: Radiology

## 2024-03-27 ENCOUNTER — Emergency Department (HOSPITAL_BASED_OUTPATIENT_CLINIC_OR_DEPARTMENT_OTHER)
Admission: EM | Admit: 2024-03-27 | Discharge: 2024-03-27 | Disposition: A | Discharge status: Home / Self Care | Attending: Emergency Medicine | Admitting: Emergency Medicine

## 2024-03-27 ENCOUNTER — Other Ambulatory Visit: Payer: Self-pay

## 2024-03-27 DIAGNOSIS — Z201 Contact with and (suspected) exposure to tuberculosis: Secondary | ICD-10-CM | POA: Insufficient documentation

## 2024-03-27 DIAGNOSIS — R0982 Postnasal drip: Secondary | ICD-10-CM | POA: Diagnosis not present

## 2024-03-27 DIAGNOSIS — J45909 Unspecified asthma, uncomplicated: Secondary | ICD-10-CM | POA: Diagnosis not present

## 2024-03-27 DIAGNOSIS — R059 Cough, unspecified: Secondary | ICD-10-CM | POA: Diagnosis not present

## 2024-03-27 NOTE — ED Notes (Signed)

## 2024-03-27 NOTE — Discharge Instructions (Addendum)
 It was a pleasure taking care of you today. 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 Your work-up was reassuring. Your Xray showed no evidence of active or dormant disease.  I recommend following up with your PCP to obtain an additional blood TB test in the next 1 to 2 weeks. Refer to the attached documentation for further management of your symptoms.   Please return to the ER if you experience chest pain, trouble breathing, intractable nausea/vomiting or any other life threatening illnesses.

## 2024-03-27 NOTE — ED Provider Notes (Signed)
 " [place] EMERGENCY DEPARTMENT AT [facility] Provider Note   CSN: [id number] Arrival date & time: 03/27/24  1215     Patient presents with: Abnormal Lab   Stephanie Cohen is a 24 y.o. 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  She denies any symptoms such as shortness of breath or hemoptysis.  Patient does have some postnasal drip and cough, which she believes is likely secondary to her seasonal allergies and asthma.  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  She is unsure if she has possibly come in contact with someone who tested positive for TB due to her clinicals.    Abnormal Lab      Prior to Admission medications  Medication Sig Start Date End Date Taking? Authorizing Provider  acetaZOLAMIDE  (DIAMOX ) 250 MG tablet Take 3 tablets (750 mg total) by mouth 2 (two) times daily. 09/19/23   [place] Adelita PENNER, MD  albuterol  (VENTOLIN  HFA) [phone] Base) MCG/ACT inhaler Inhale 2 puffs into the lungs every 6 (six) hours as needed for wheezing. 04/26/23     amphetamine -dextroamphetamine (ADDERALL XR) 20 MG 24 hr capsule Take 1 capsule (20 mg total) by mouth daily. 02/19/24   [place] Adelita PENNER, MD  cetirizine (ZYRTEC) 10 MG tablet Take 10 mg by mouth 2 (two) times daily.    [provider]  Cholecalciferol  (VITAMIN D3) 1.25 MG (50000 UT) CAPS Take 1 capsule (1.25 mg total) by mouth once a week. 01/22/24   [place] Adelita PENNER, MD  ferrous sulfate  325 (65 FE) MG tablet TAKE 1 TABLET BY MOUTH EVERY DAY WITH BREAKFAST 09/13/23   Dorsey, John T IV, MD  Iron -FA-B Cmp-C-Biot-Probiotic (FUSION PLUS) CAPS Take 1 capsule by mouth daily. 02/19/24   [place] Adelita PENNER, MD  metFORMIN  (GLUCOPHAGE ) 500 MG  tablet Take 1 tablet (500 mg total) by mouth daily with breakfast. 01/22/24   [place] Adelita PENNER, MD  Ubrogepant  (UBRELVY ) 100 MG TABS Take 100 mg by mouth every 2 (two) hours as needed. Maximum 200mg  a day. 01/24/22   Ines Onetha NOVAK, MD    Allergies: Antihistamines, loratadine-type    Review of Systems  HENT:  Positive for postnasal drip.     Updated Vital Signs BP 126/84   Pulse 78   Temp 98.3 F (36.8 C)   Resp 18   SpO2 100%   Physical Exam Vitals and nursing note reviewed.  Constitutional:      Appearance: Normal appearance.  HENT:     Head: Normocephalic and atraumatic.     Mouth/Throat:     Mouth: Mucous membranes are moist.  Eyes:     General: No scleral icterus.       Right eye: No discharge.        Left eye: No discharge.     Conjunctiva/sclera: Conjunctivae normal.  Cardiovascular:     Rate and Rhythm: Normal rate and regular rhythm.     Pulses: Normal pulses.  Pulmonary:     Effort: Pulmonary effort is normal.     Breath sounds: Normal breath sounds.  Abdominal:     General: There is no distension.     Tenderness: There is no abdominal tenderness.  Musculoskeletal:        General:  No deformity.     Cervical back: Normal range of motion.  Skin:    General: Skin is warm and dry.     Capillary Refill: Capillary refill takes less than 2 seconds.  Neurological:     Mental Status: She is alert.     Motor: No weakness.  Psychiatric:        Mood and Affect: Mood normal.     (all labs ordered are listed, but only abnormal results are displayed) Labs Reviewed - No data to display  EKG: None  Radiology: DG Chest 2 View Result Date: 03/27/2024 CLINICAL DATA:  Positive TB test. EXAM: CHEST - 2 VIEW COMPARISON:  Aug 08, 2021 FINDINGS: The heart size and mediastinal contours are within normal limits. Low lung volumes are noted. Both lungs are clear. The visualized skeletal structures are unremarkable. IMPRESSION: No active cardiopulmonary disease.  Electronically Signed   By: Suzen Dials M.D.   On: 03/27/2024 14:02     Procedures   Medications Ordered in the ED - No data to display                               Medical Decision Making Amount and/or Complexity of Data Reviewed Radiology: ordered.   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  Differential diagnosis: Dormant tuberculosis, false positive, active tuberculosis.  Patient denies any TB symptoms.  Chest x-ray obtained to rule out possibility of TB.  Chest x-ray shows no acute abnormality concerning for TB.  I personally viewed and interpreted these images and agree with radiologist interpretation.  No additional workup is required at this time.  Patient has been given documentation on symptoms to be aware of as well as ER return precautions.  Patient verbalized understanding to this.  Patient's vital signs are stable.  Patient is appropriate for discharge at this time.   Final diagnoses:  Contact with and (suspected) exposure to tuberculosis    ED Discharge Orders     None          Stephanie Cohen 03/27/24 1411    Rogelia Jerilynn GORMAN, MD 04/03/24 [phone]  "

## 2024-03-27 NOTE — ED Triage Notes (Signed)
 Patient states positive TB test. Denies any symptoms. 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

## 2024-03-31 ENCOUNTER — Ambulatory Visit (INDEPENDENT_AMBULATORY_CARE_PROVIDER_SITE_OTHER): Admitting: Family Medicine

## 2024-04-01 ENCOUNTER — Encounter (INDEPENDENT_AMBULATORY_CARE_PROVIDER_SITE_OTHER): Payer: Self-pay | Admitting: Family Medicine

## 2024-04-01 ENCOUNTER — Ambulatory Visit (INDEPENDENT_AMBULATORY_CARE_PROVIDER_SITE_OTHER): Admitting: Family Medicine

## 2024-04-01 DIAGNOSIS — E559 Vitamin D deficiency, unspecified: Secondary | ICD-10-CM | POA: Diagnosis not present

## 2024-04-01 DIAGNOSIS — E611 Iron deficiency: Secondary | ICD-10-CM

## 2024-04-01 DIAGNOSIS — E669 Obesity, unspecified: Secondary | ICD-10-CM | POA: Diagnosis not present

## 2024-04-01 DIAGNOSIS — Z6841 Body Mass Index (BMI) 40.0 and over, adult: Secondary | ICD-10-CM

## 2024-04-01 DIAGNOSIS — E88819 Insulin resistance, unspecified: Secondary | ICD-10-CM | POA: Diagnosis not present

## 2024-04-01 DIAGNOSIS — F902 Attention-deficit hyperactivity disorder, combined type: Secondary | ICD-10-CM | POA: Diagnosis not present

## 2024-04-01 MED ORDER — AMPHETAMINE-DEXTROAMPHET ER 20 MG PO CP24: 20.0000 mg | ORAL_CAPSULE | Freq: Every day | ORAL | 0 refills | Status: DC

## 2024-04-01 MED ORDER — FERROUS SULFATE 325 (65 FE) MG PO TABS: 325.0000 mg | ORAL_TABLET | Freq: Every day | ORAL | 0 refills | Status: AC

## 2024-04-01 MED ORDER — METFORMIN HCL 500 MG PO TABS: 500.0000 mg | ORAL_TABLET | Freq: Every day | ORAL | 0 refills | Status: DC

## 2024-04-01 MED ORDER — VITAMIN D3 1.25 MG (50000 UT) PO CAPS: 1.0000 | ORAL_CAPSULE | ORAL | 0 refills | Status: AC

## 2024-04-01 NOTE — Progress Notes (Signed)
 "  Stephanie Cohen, D.O.  ABFM, ABOM Specializing in Clinical Bariatric Medicine  Office located at: [address] [place], [place]  [address]       Medications Discontinued During This Encounter  Medication Reason   ferrous sulfate  325 (65 FE) MG tablet Reorder   Cholecalciferol  (VITAMIN D3) 1.25 MG (50000 UT) CAPS Reorder   metFORMIN  (GLUCOPHAGE ) 500 MG tablet Reorder   amphetamine -dextroamphetamine (ADDERALL XR) 20 MG 24 hr capsule Reorder     Meds ordered this encounter  Medications   amphetamine -dextroamphetamine (ADDERALL XR) 20 MG 24 hr capsule    Sig: Take 1 capsule (20 mg total) by mouth daily.    Dispense:  30 capsule    Refill:  0   Cholecalciferol  (VITAMIN D3) 1.25 MG (50000 UT) CAPS    Sig: Take 1 capsule (1.25 mg total) by mouth once a week.    Dispense:  12 capsule    Refill:  0   metFORMIN  (GLUCOPHAGE ) 500 MG tablet    Sig: Take 1 tablet (500 mg total) by mouth daily with breakfast.    Dispense:  90 tablet    Refill:  0   ferrous sulfate  325 (65 FE) MG tablet    Sig: Take 1 tablet (325 mg total) by mouth daily with breakfast.    Dispense:  90 tablet    Refill:  0      A) FOR THE CHRONIC DISEASE OF OBESITY:  Obesity with starting BMI of 50.6 BMI 45.0-49.9, adult (HCC) current BMI  Chief complaint: Obesity Aerin is here to discuss her progress with her obesity treatment plan.   History of present illness / Interval history:  KARRISSA PARCHMENT is here today for her follow-up office visit.  Since last OV on 02/19/2024, pt is up 2 lbs.   She was a little off-plan over the holidays, but has been getting back to adhering to her MP and health goals.   02/19/24 15:00 04/01/24 13:00   Body Fat % 49.9 % 50.2 %  Muscle Mass (lbs) 129.4 lbs 129.6 lbs  Fat Mass (lbs) 135.8 lbs 137.6 lbs  Total Body Water (lbs) 95.8 lbs 94.8 lbs  Visceral Fat Rating  14 14  Counseling done on how various foods will affect these numbers and how to maximize success    Total lbs lost to date: -30 lbs Total Fat Mass in lbs lost to date: -26.6 Total weight loss percentage to date: -9.87 %   Nutrition Therapy She is  keeping a food journal and adhering to recommended goals of 1450-1600 calories and 95 or more grams of protein daily and states she is following her eating plan approximately 50 % of the time.   - Tracking Calories/Macros: no  - Eating More Whole Foods: no  - Adequate Protein Intake: yes  - Adequate Water Intake: yes; typically drinks 3-4 bottles of water  - Skipping Meals: yes  - Sleeping 7-9 Hours/ Night: yes   Adisynn is currently in the action stage of change. As such, her goal is to continue weight management plan.  She has agreed to: continue current plan   Physical Activity Pt is not exercising.   Teale has been advised to work up to 300-450 minutes of moderate intensity aerobic activity a week and strengthening exercises 2-3 times per week for cardiovascular health, weight loss maintenance and preservation of muscle mass.  She has agreed to : Think about enjoyable ways to increase daily physical activity and overcoming barriers to  exercise, Increase physical activity in their day and reduce sedentary time (increase NEAT)., Increase volume of physical activity to a goal of 240 minutes a week, and Combine aerobic and strengthening exercises for efficiency and improved cardiometabolic health.   Behavioral Modifications Evidence-based interventions for health behavior change were utilized today including the discussion of  1) self monitoring techniques:  Journaling, meal prepping 2) problem-solving barriers:  Does not enjoy cooking; finding healthier alternatives such as [employer] natural.  3) self care:  exercise  Regarding patient's less desirable eating habits and patterns, we employed the technique of small changes.   We discussed the following today: increasing lean protein intake to established goals, decreasing  simple carbohydrates , continue to work on implementation of reduced calorie nutritional plan, continue to practice mindfulness when eating, and discussed pre-packaged healthier meals such as [employer] All Natural, Whole 30 chicken meals, Longlife meal prep and Factor Meals etc Additional resources provided today: Handout on benefits of metformin  for weight loss   Medical Interventions/ Pharmacotherapy Previous Bariatric surgery: n/a Pharmacotherapy for weight loss: She is currently taking Metformin  500 mg once daily for medical weight loss.    We discussed various medication options to help Jimmi with her weight loss efforts and we both agreed to : Continue with current nutritional and behavioral strategies   B) OBESITY RELATED CONDITIONS ADDRESSED TODAY:   Insulin  resistance Assessment & Plan Lab Results  Component Value Date   HGBA1C 5.5 11/19/2023   HGBA1C 5.6 10/03/2022   HGBA1C 5.6 01/29/2022   INSULIN  60.1 (H) 11/19/2023   INSULIN  25.5 (H) 10/03/2022   INSULIN  17.2 01/29/2022  Prescribed Metformin  500 mg once daily by her OBGYN for her PCOS. Educated pt how Metformin  can help in the management of insulin  resistance. Reports hunger and cravings are well controlled. No acute concerns. Cont adherence to medication (refill today.)  She recently had labs with PCP on 03/26/2024, including a FLP, CMP, CBC, Vit D, and TSH.   - I reviewed these today which added to my evaluation of pt's current nutritional advice, overall wt loss plan and strategies.  Counseling done with pt on all of them- medical significance of them and how they may relate to her current food choices / behaviors.   - A1c was 5.4. Hemoglobin and Hematocrit WNL. Kidney function WNL. TRIGS, LDL, and CHOL WNL. No acute concerns. Cont adherence to meal plan consisting of low carbs, low saturated/trans fats, and high proteins.     ADHD (attention deficit hyperactivity disorder), combined type Assessment & Plan Pt has a h/o  ADHD. Currently prescribed Adderall 20 mg once daily by Dr RAYMOND since pt does not currently have a PCP.  She reports she only takes it as needed, such as when she is working or she has something to do. She states it keeps her up at night. Reports experiencing constipation. Cont medication(refill today).  PDMP manually reviewed and no discrepancies found; refills provided.  Increase fiber and water intake to help aid constipation.    Vitamin D  deficiency Assessment & Plan Lab Results  Component Value Date   VD25OH 43.2 11/19/2023   VD25OH 15.7 (L) 04/09/2023   VD25OH 26.9 (L) 10/03/2022  Currently on OTC Vit D 5,000 units once weekly. Most recent labs on 03/26/2024 from PCP showed Vit D levels at 44. Cont regimen(refill today). Will recheck as necessary.    Iron  deficiency Assessment & Plan Pt has a h/o of iron  deficiency. Currently take ferrous sulfate  325 mg once daily with good  compliance and tolerance. Most recent CBC per her PCP on 03/26/24 was WNL. Cont PNP. Cont adherence to medication(refill today). F/u with PCP as needed.      Medications Discontinued During This Encounter  Medication Reason   ferrous sulfate  325 (65 FE) MG tablet Reorder   Cholecalciferol  (VITAMIN D3) 1.25 MG (50000 UT) CAPS Reorder   metFORMIN  (GLUCOPHAGE ) 500 MG tablet Reorder   amphetamine -dextroamphetamine (ADDERALL XR) 20 MG 24 hr capsule Reorder     Meds ordered this encounter  Medications   amphetamine -dextroamphetamine (ADDERALL XR) 20 MG 24 hr capsule    Sig: Take 1 capsule (20 mg total) by mouth daily.    Dispense:  30 capsule    Refill:  0   Cholecalciferol  (VITAMIN D3) 1.25 MG (50000 UT) CAPS    Sig: Take 1 capsule (1.25 mg total) by mouth once a week.    Dispense:  12 capsule    Refill:  0   metFORMIN  (GLUCOPHAGE ) 500 MG tablet    Sig: Take 1 tablet (500 mg total) by mouth daily with breakfast.    Dispense:  90 tablet    Refill:  0   ferrous sulfate  325 (65 FE) MG tablet    Sig: Take 1  tablet (325 mg total) by mouth daily with breakfast.    Dispense:  90 tablet    Refill:  0      Follow up:   Return 04/23/2024 2:20 PM [place] Adelita PENNER, MD.  She was informed of the importance of frequent follow up visits to maximize her success with intensive lifestyle modifications for her multiple health conditions.   Weight Summary and Biometrics   Weight Lost Since Last Visit: 0lb  Weight Gained Since Last Visit: 2lb    Vitals Temp: 98.3 F (36.8 C) BP: 100/67 Pulse Rate: 79 SpO2: 98 %   Anthropometric Measurements Height: 5' 5 (1.651 m) Weight: 274 lb (124.3 kg) BMI (Calculated): 45.6 Weight at Last Visit: 272lb Weight Lost Since Last Visit: 0lb Weight Gained Since Last Visit: 2lb Starting Weight: 304lb Total Weight Loss (lbs): 30 lb (13.6 kg)   Body Composition  Body Fat %: 50.2 % Fat Mass (lbs): 137.6 lbs Muscle Mass (lbs): 129.6 lbs Total Body Water (lbs): 94.8 lbs Visceral Fat Rating : 14   Other Clinical Data Fasting: no Labs: no Today's Visit #: 26 Starting Date: 10/03/21    Objective:   PHYSICAL EXAM: Blood pressure 100/67, pulse 79, temperature 98.3 F (36.8 C), height 5' 5 (1.651 m), weight 274 lb (124.3 kg), SpO2 98%. Body mass index is 45.6 kg/m.  General: she is overweight, cooperative and in no acute distress. PSYCH: Has normal mood, affect and thought process.   HEENT: EOMI, sclerae are anicteric. Lungs: Normal breathing effort, no conversational dyspnea. Extremities: Moves * 4 Neurologic: A and O * 3, good insight  DIAGNOSTIC DATA REVIEWED: BMET    Component Value Date/Time   NA 138 10/03/2022 1145   K 4.1 10/03/2022 1145   CL 110 (H) 10/03/2022 1145   CO2 CANCELED 10/03/2022 1145   GLUCOSE 72 10/03/2022 1145   GLUCOSE 97 02/12/2022 1431   BUN 7 10/03/2022 1145   CREATININE 0.76 10/03/2022 1145   CREATININE 0.90 02/12/2022 1431   CALCIUM 8.7 10/03/2022 1145   GFRNONAA >60 02/12/2022 1431   Lab Results   Component Value Date   HGBA1C 5.5 11/19/2023   HGBA1C 5.4 10/03/2021   Lab Results  Component Value Date   INSULIN  60.1 (H) 11/19/2023  INSULIN  17.7 10/03/2021   Lab Results  Component Value Date   TSH 3.120 10/03/2021   CBC    Component Value Date/Time   WBC 10.8 11/19/2023 1609   WBC 10.8 (H) 02/12/2022 1431   WBC 12.6 (H) 08/09/2021 0113   RBC 6.02 (H) 11/19/2023 1609   RBC 6.79 (H) 02/12/2022 1431   RBC 6.75 (H) 02/12/2022 1431   HGB 12.5 11/19/2023 1609   HCT 43.3 11/19/2023 1609   PLT 369 11/19/2023 1609   MCV 72 (L) 11/19/2023 1609   MCH 20.8 (L) 11/19/2023 1609   MCH 19.1 (L) 02/12/2022 1431   MCHC 28.9 (L) 11/19/2023 1609   MCHC 32.0 02/12/2022 1431   RDW 18.4 (H) 11/19/2023 1609   Iron  Studies    Component Value Date/Time   IRON  28 11/19/2023 1609   TIBC 254 11/19/2023 1609   FERRITIN 111 11/19/2023 1609   IRONPCTSAT 11 (L) 11/19/2023 1609   Lipid Panel     Component Value Date/Time   CHOL 135 10/03/2021 1006   TRIG 68 10/03/2021 1006   HDL 43 10/03/2021 1006   LDLCALC 78 10/03/2021 1006   Hepatic Function Panel     Component Value Date/Time   PROT 7.1 10/03/2022 1145   ALBUMIN 4.1 10/03/2022 1145   AST 18 10/03/2022 1145   AST 13 (L) 02/12/2022 1431   ALT 11 10/03/2022 1145   ALT 10 02/12/2022 1431   ALKPHOS 60 10/03/2022 1145   BILITOT 0.3 10/03/2022 1145   BILITOT 0.4 02/12/2022 1431      Component Value Date/Time   TSH 3.120 10/03/2021 1006   Nutritional Lab Results  Component Value Date   VD25OH 43.2 11/19/2023   VD25OH 15.7 (L) 04/09/2023   VD25OH 26.9 (L) 10/03/2022    Attestations:   LILLETTE Feliciano Mingle, acting as a stage manager for Stephanie Jenkins, DO., have compiled all relevant documentation for today's office visit on behalf of Stephanie Jenkins, DO, while in the presence of [employer], DO.   I have reviewed the above documentation for accuracy and completeness, and I agree with the above. Stephanie JINNY Cohen,  D.O.  The 21st Century Cures Act was signed into law in 2016 which includes the topic of electronic health records.  This provides immediate access to information in MyChart.  This includes consultation notes, operative notes, office notes, lab results and pathology reports.  If you have any questions about what you read please let us  know at your next visit so we can discuss your concerns and take corrective action if need be.  We are right here with you.  "

## 2024-04-23 ENCOUNTER — Ambulatory Visit (INDEPENDENT_AMBULATORY_CARE_PROVIDER_SITE_OTHER): Admitting: Family Medicine

## 2024-04-23 ENCOUNTER — Encounter (INDEPENDENT_AMBULATORY_CARE_PROVIDER_SITE_OTHER): Payer: Self-pay | Admitting: Family Medicine

## 2024-04-23 VITALS — BP 105/70 | HR 80 | Temp 98.7°F | Ht 65.0 in | Wt 274.0 lb

## 2024-04-23 DIAGNOSIS — E88819 Insulin resistance, unspecified: Secondary | ICD-10-CM

## 2024-04-23 DIAGNOSIS — F902 Attention-deficit hyperactivity disorder, combined type: Secondary | ICD-10-CM

## 2024-04-23 DIAGNOSIS — Z6841 Body Mass Index (BMI) 40.0 and over, adult: Secondary | ICD-10-CM

## 2024-04-23 MED ORDER — AMPHETAMINE-DEXTROAMPHET ER 20 MG PO CP24: 20.0000 mg | ORAL_CAPSULE | Freq: Every day | ORAL | 0 refills | Status: AC

## 2024-04-23 MED ORDER — METFORMIN HCL 500 MG PO TABS: 500.0000 mg | ORAL_TABLET | Freq: Every day | ORAL | 0 refills | Status: AC

## 2024-04-23 NOTE — Assessment & Plan Note (Signed)
 Tolerating metformin  with no GI side effects.  Needs a refill today and will continue currentdose as she is is tolerating this and still working on getting all food in daily.

## 2024-04-23 NOTE — Progress Notes (Unsigned)
 "  SUBJECTIVE:  Chief Complaint: Obesity  Interim History: Patient here for follow up.  She mentions her holidays were alright- didn't  feel she kept track as well as she needed to.  She was still sticking to certain things overall like not drinking soda or juices.  She isn't able to tolerate most sugary things due it no longer tasting good.  She is trying to be mindful overall and not be too overindulgent.  She wasn't really able to prepare for the recent winter weather but her mom went to the store and was able to get a good amount of food in consistently. After she went back to work she got protein waffles from [employer] and felt those were alright.   Stephanie Cohen is here to discuss her progress with her obesity treatment plan. She is on the keeping a food journal and adhering to recommended goals of 1450-1600 calories and 95 grams of protein and states she is following her eating plan approximately 50 % of the time. She states she is not exercising.  OBJECTIVE: Visit Diagnoses: Problem List Items Addressed This Visit       Endocrine   Insulin  resistance   Relevant Medications   metFORMIN  (GLUCOPHAGE ) 500 MG tablet     Other   Morbid obesity (HCC)   Relevant Medications   metFORMIN  (GLUCOPHAGE ) 500 MG tablet   Other Visit Diagnoses       ADHD (attention deficit hyperactivity disorder), combined type    -  Primary     BMI 45.0-49.9, adult (HCC) current BMI 45.6       Relevant Medications   metFORMIN  (GLUCOPHAGE ) 500 MG tablet       Vitals Temp: 98.7 F (37.1 C) BP: 105/70 Pulse Rate: 80 SpO2: 98 %   Anthropometric Measurements Height: 5' 5 (1.651 m) Weight: 274 lb (124.3 kg) BMI (Calculated): 45.6 Weight at Last Visit: 274 lb Weight Lost Since Last Visit: 2 Weight Gained Since Last Visit: 0 Starting Weight: 304 lb Total Weight Loss (lbs): 30 lb (13.6 kg)   Body Composition  Body Fat %: 48.7 % Fat Mass (lbs): 132.4 lbs Muscle Mass (lbs): 132.6 lbs Total Body Water  (lbs): 93.2 lbs Visceral Fat Rating : 13   Other Clinical Data Today's Visit #: 11 Starting Date: 10/03/21     ASSESSMENT AND PLAN: Assessment & Plan ADHD (attention deficit hyperactivity disorder), combined type Does find the Adderall to be helpful with getting her through stress and changes.  She is finding certain stimuli are pushing her to have more labile emotions.  She is trying to find ways to calm herself.  She doesn't feel she has time to do much in light of the things she has signed up for.  Insulin  resistance Tolerating metformin  with no GI side effects.  Needs a refill today and will continue currentdose as she is is tolerating this and still working on getting all food in daily.  BMI 45.0-49.9, adult (HCC) current BMI 45.6  Obesity with starting BMI of 50.6    Diet: Stephanie Cohen is currently in the action stage of change. As such, her goal is to continue with weight loss efforts and has agreed to keeping a food journal and adhering to recommended goals of 1450-1600 calories and 95 or more grams of protein daily.   Exercise:  For substantial health benefits, adults should do at least 150 minutes (2 hours and 30 minutes) a week of moderate-intensity, or 75 minutes (1 hour and 15 minutes) a week  of vigorous-intensity aerobic physical activity, or an equivalent combination of moderate- and vigorous-intensity aerobic activity. Aerobic activity should be performed in episodes of at least 10 minutes, and preferably, it should be spread throughout the week.  Behavior Modification:  We discussed the following Behavioral Modification Strategies today: increasing lean protein intake, decreasing simple carbohydrates, increasing vegetables, meal planning and cooking strategies, keeping healthy foods in the home, and planning for success.   Return in about 5 weeks (around 05/28/2024).   She was informed of the importance of frequent follow up visits to maximize her success with intensive  lifestyle modifications for her multiple health conditions.  Attestation Statements:   Reviewed by clinician on day of visit: allergies, medications, problem list, medical history, surgical history, family history, social history, and previous encounter notes.    [name], MD "

## 2024-05-27 ENCOUNTER — Ambulatory Visit (INDEPENDENT_AMBULATORY_CARE_PROVIDER_SITE_OTHER): Admitting: Family Medicine

## 2024-06-11 ENCOUNTER — Ambulatory Visit: Admitting: Family Medicine

## 2024-12-22 ENCOUNTER — Ambulatory Visit: Admitting: Family Medicine
